# Patient Record
Sex: Female | Born: 1970 | Race: Black or African American | Hispanic: No | Marital: Single | Smoking: Never smoker
Health system: Southern US, Community
[De-identification: ages and names within clinical notes are randomized; demographics above are authoritative.]

## PROBLEM LIST (undated history)

## (undated) DIAGNOSIS — M1990 Unspecified osteoarthritis, unspecified site: Secondary | ICD-10-CM

## (undated) DIAGNOSIS — E119 Type 2 diabetes mellitus without complications: Secondary | ICD-10-CM

## (undated) DIAGNOSIS — I10 Essential (primary) hypertension: Secondary | ICD-10-CM

## (undated) DIAGNOSIS — R000 Tachycardia, unspecified: Secondary | ICD-10-CM

## (undated) DIAGNOSIS — J45909 Unspecified asthma, uncomplicated: Secondary | ICD-10-CM

## (undated) DIAGNOSIS — G5600 Carpal tunnel syndrome, unspecified upper limb: Secondary | ICD-10-CM

## (undated) DIAGNOSIS — I4510 Unspecified right bundle-branch block: Secondary | ICD-10-CM

## (undated) HISTORY — PX: TUBAL LIGATION: SHX77

## (undated) HISTORY — DX: Carpal tunnel syndrome, unspecified upper limb: G56.00

---

## 1998-02-19 ENCOUNTER — Emergency Department (HOSPITAL_COMMUNITY): Admission: EM | Admit: 1998-02-19 | Discharge: 1998-02-19 | Payer: Self-pay | Admitting: Emergency Medicine

## 1998-09-14 ENCOUNTER — Emergency Department (HOSPITAL_COMMUNITY): Admission: EM | Admit: 1998-09-14 | Discharge: 1998-09-14 | Payer: Self-pay | Admitting: Internal Medicine

## 1998-11-16 ENCOUNTER — Emergency Department (HOSPITAL_COMMUNITY): Admission: EM | Admit: 1998-11-16 | Discharge: 1998-11-16 | Payer: Self-pay | Admitting: Emergency Medicine

## 1999-03-16 ENCOUNTER — Emergency Department (HOSPITAL_COMMUNITY): Admission: EM | Admit: 1999-03-16 | Discharge: 1999-03-16 | Payer: Self-pay | Admitting: Emergency Medicine

## 1999-07-04 ENCOUNTER — Emergency Department (HOSPITAL_COMMUNITY): Admission: EM | Admit: 1999-07-04 | Discharge: 1999-07-04 | Payer: Self-pay | Admitting: Emergency Medicine

## 1999-09-03 ENCOUNTER — Inpatient Hospital Stay (HOSPITAL_COMMUNITY): Admission: EM | Admit: 1999-09-03 | Discharge: 1999-09-03 | Payer: Self-pay | Admitting: Obstetrics & Gynecology

## 1999-12-24 ENCOUNTER — Emergency Department (HOSPITAL_COMMUNITY): Admission: EM | Admit: 1999-12-24 | Discharge: 1999-12-24 | Payer: Self-pay | Admitting: Emergency Medicine

## 1999-12-24 ENCOUNTER — Encounter: Payer: Self-pay | Admitting: Emergency Medicine

## 2000-04-07 ENCOUNTER — Inpatient Hospital Stay (HOSPITAL_COMMUNITY): Admission: AD | Admit: 2000-04-07 | Discharge: 2000-04-07 | Payer: Self-pay | Admitting: Obstetrics

## 2000-04-16 ENCOUNTER — Inpatient Hospital Stay (HOSPITAL_COMMUNITY): Admission: AD | Admit: 2000-04-16 | Discharge: 2000-04-16 | Payer: Self-pay | Admitting: Obstetrics

## 2000-09-02 ENCOUNTER — Emergency Department (HOSPITAL_COMMUNITY): Admission: EM | Admit: 2000-09-02 | Discharge: 2000-09-02 | Payer: Self-pay | Admitting: Psychiatry

## 2000-10-16 ENCOUNTER — Encounter: Payer: Self-pay | Admitting: Obstetrics & Gynecology

## 2000-10-16 ENCOUNTER — Inpatient Hospital Stay (HOSPITAL_COMMUNITY): Admission: AD | Admit: 2000-10-16 | Discharge: 2000-10-16 | Payer: Self-pay | Admitting: Obstetrics & Gynecology

## 2000-11-10 ENCOUNTER — Encounter (INDEPENDENT_AMBULATORY_CARE_PROVIDER_SITE_OTHER): Payer: Self-pay

## 2000-11-10 ENCOUNTER — Encounter: Payer: Self-pay | Admitting: Obstetrics & Gynecology

## 2000-11-10 ENCOUNTER — Inpatient Hospital Stay (HOSPITAL_COMMUNITY): Admission: AD | Admit: 2000-11-10 | Discharge: 2000-11-12 | Payer: Self-pay | Admitting: *Deleted

## 2000-11-27 ENCOUNTER — Encounter: Admission: RE | Admit: 2000-11-27 | Discharge: 2000-11-27 | Payer: Self-pay | Admitting: Obstetrics

## 2000-12-03 ENCOUNTER — Ambulatory Visit (HOSPITAL_COMMUNITY): Admission: AD | Admit: 2000-12-03 | Discharge: 2000-12-03 | Payer: Self-pay | Admitting: Obstetrics

## 2000-12-03 ENCOUNTER — Encounter: Payer: Self-pay | Admitting: Obstetrics

## 2000-12-03 ENCOUNTER — Encounter (INDEPENDENT_AMBULATORY_CARE_PROVIDER_SITE_OTHER): Payer: Self-pay

## 2001-01-06 ENCOUNTER — Encounter: Admission: RE | Admit: 2001-01-06 | Discharge: 2001-01-06 | Payer: Self-pay | Admitting: Obstetrics & Gynecology

## 2001-01-20 ENCOUNTER — Encounter: Admission: RE | Admit: 2001-01-20 | Discharge: 2001-01-20 | Payer: Self-pay | Admitting: Obstetrics & Gynecology

## 2001-01-21 ENCOUNTER — Encounter: Payer: Self-pay | Admitting: Emergency Medicine

## 2001-01-21 ENCOUNTER — Emergency Department (HOSPITAL_COMMUNITY): Admission: EM | Admit: 2001-01-21 | Discharge: 2001-01-21 | Payer: Self-pay | Admitting: Emergency Medicine

## 2001-05-26 ENCOUNTER — Inpatient Hospital Stay (HOSPITAL_COMMUNITY): Admission: AD | Admit: 2001-05-26 | Discharge: 2001-05-26 | Payer: Self-pay | Admitting: *Deleted

## 2002-02-11 ENCOUNTER — Emergency Department (HOSPITAL_COMMUNITY): Admission: EM | Admit: 2002-02-11 | Discharge: 2002-02-11 | Payer: Self-pay | Admitting: Emergency Medicine

## 2002-05-03 ENCOUNTER — Emergency Department (HOSPITAL_COMMUNITY): Admission: EM | Admit: 2002-05-03 | Discharge: 2002-05-03 | Payer: Self-pay | Admitting: *Deleted

## 2002-06-09 ENCOUNTER — Emergency Department (HOSPITAL_COMMUNITY): Admission: EM | Admit: 2002-06-09 | Discharge: 2002-06-09 | Payer: Self-pay | Admitting: Emergency Medicine

## 2003-03-26 ENCOUNTER — Inpatient Hospital Stay (HOSPITAL_COMMUNITY): Admission: AD | Admit: 2003-03-26 | Discharge: 2003-03-26 | Payer: Self-pay | Admitting: Obstetrics & Gynecology

## 2003-04-15 ENCOUNTER — Encounter: Payer: Self-pay | Admitting: Obstetrics

## 2003-04-15 ENCOUNTER — Ambulatory Visit (HOSPITAL_COMMUNITY): Admission: RE | Admit: 2003-04-15 | Discharge: 2003-04-15 | Payer: Self-pay | Admitting: Obstetrics

## 2003-04-25 ENCOUNTER — Inpatient Hospital Stay (HOSPITAL_COMMUNITY): Admission: AD | Admit: 2003-04-25 | Discharge: 2003-04-25 | Payer: Self-pay | Admitting: *Deleted

## 2003-04-25 ENCOUNTER — Encounter (INDEPENDENT_AMBULATORY_CARE_PROVIDER_SITE_OTHER): Payer: Self-pay | Admitting: Specialist

## 2003-09-06 ENCOUNTER — Emergency Department (HOSPITAL_COMMUNITY): Admission: EM | Admit: 2003-09-06 | Discharge: 2003-09-06 | Payer: Self-pay | Admitting: Family Medicine

## 2003-12-22 ENCOUNTER — Emergency Department (HOSPITAL_COMMUNITY): Admission: EM | Admit: 2003-12-22 | Discharge: 2003-12-23 | Payer: Self-pay | Admitting: Emergency Medicine

## 2004-05-17 ENCOUNTER — Emergency Department (HOSPITAL_COMMUNITY): Admission: EM | Admit: 2004-05-17 | Discharge: 2004-05-18 | Payer: Self-pay | Admitting: Emergency Medicine

## 2004-06-29 ENCOUNTER — Emergency Department (HOSPITAL_COMMUNITY): Admission: EM | Admit: 2004-06-29 | Discharge: 2004-06-30 | Payer: Self-pay | Admitting: Emergency Medicine

## 2004-07-01 HISTORY — PX: TUBAL LIGATION: SHX77

## 2004-08-30 ENCOUNTER — Inpatient Hospital Stay (HOSPITAL_COMMUNITY): Admission: AD | Admit: 2004-08-30 | Discharge: 2004-08-30 | Payer: Self-pay | Admitting: Obstetrics

## 2004-09-08 ENCOUNTER — Inpatient Hospital Stay (HOSPITAL_COMMUNITY): Admission: AD | Admit: 2004-09-08 | Discharge: 2004-09-08 | Payer: Self-pay | Admitting: Obstetrics & Gynecology

## 2004-10-03 ENCOUNTER — Emergency Department (HOSPITAL_COMMUNITY): Admission: EM | Admit: 2004-10-03 | Discharge: 2004-10-03 | Payer: Self-pay | Admitting: Emergency Medicine

## 2004-11-06 ENCOUNTER — Ambulatory Visit (HOSPITAL_COMMUNITY): Admission: RE | Admit: 2004-11-06 | Discharge: 2004-11-06 | Payer: Self-pay | Admitting: Obstetrics & Gynecology

## 2005-01-14 ENCOUNTER — Inpatient Hospital Stay (HOSPITAL_COMMUNITY): Admission: AD | Admit: 2005-01-14 | Discharge: 2005-01-14 | Payer: Self-pay | Admitting: Obstetrics & Gynecology

## 2005-01-30 ENCOUNTER — Inpatient Hospital Stay (HOSPITAL_COMMUNITY): Admission: AD | Admit: 2005-01-30 | Discharge: 2005-01-30 | Payer: Self-pay | Admitting: Obstetrics

## 2005-02-08 ENCOUNTER — Ambulatory Visit (HOSPITAL_COMMUNITY): Admission: RE | Admit: 2005-02-08 | Discharge: 2005-02-08 | Payer: Self-pay | Admitting: Obstetrics & Gynecology

## 2005-03-06 ENCOUNTER — Inpatient Hospital Stay (HOSPITAL_COMMUNITY): Admission: AD | Admit: 2005-03-06 | Discharge: 2005-03-06 | Payer: Self-pay | Admitting: Obstetrics & Gynecology

## 2005-03-10 ENCOUNTER — Inpatient Hospital Stay (HOSPITAL_COMMUNITY): Admission: AD | Admit: 2005-03-10 | Discharge: 2005-03-10 | Payer: Self-pay | Admitting: Obstetrics & Gynecology

## 2005-03-27 ENCOUNTER — Inpatient Hospital Stay (HOSPITAL_COMMUNITY): Admission: AD | Admit: 2005-03-27 | Discharge: 2005-03-27 | Payer: Self-pay | Admitting: Obstetrics & Gynecology

## 2005-04-01 ENCOUNTER — Inpatient Hospital Stay (HOSPITAL_COMMUNITY): Admission: AD | Admit: 2005-04-01 | Discharge: 2005-04-02 | Payer: Self-pay | Admitting: Obstetrics & Gynecology

## 2005-04-04 ENCOUNTER — Inpatient Hospital Stay (HOSPITAL_COMMUNITY): Admission: AD | Admit: 2005-04-04 | Discharge: 2005-04-04 | Payer: Self-pay | Admitting: Obstetrics & Gynecology

## 2005-04-10 ENCOUNTER — Inpatient Hospital Stay (HOSPITAL_COMMUNITY): Admission: AD | Admit: 2005-04-10 | Discharge: 2005-04-10 | Payer: Self-pay | Admitting: Obstetrics

## 2005-04-14 ENCOUNTER — Inpatient Hospital Stay (HOSPITAL_COMMUNITY): Admission: AD | Admit: 2005-04-14 | Discharge: 2005-04-17 | Payer: Self-pay | Admitting: Obstetrics

## 2005-04-15 ENCOUNTER — Encounter (INDEPENDENT_AMBULATORY_CARE_PROVIDER_SITE_OTHER): Payer: Self-pay | Admitting: Specialist

## 2006-11-25 ENCOUNTER — Emergency Department (HOSPITAL_COMMUNITY): Admission: EM | Admit: 2006-11-25 | Discharge: 2006-11-25 | Payer: Self-pay | Admitting: Family Medicine

## 2006-12-22 ENCOUNTER — Ambulatory Visit (HOSPITAL_COMMUNITY): Admission: RE | Admit: 2006-12-22 | Discharge: 2006-12-22 | Payer: Self-pay | Admitting: Obstetrics and Gynecology

## 2007-03-26 ENCOUNTER — Emergency Department (HOSPITAL_COMMUNITY): Admission: EM | Admit: 2007-03-26 | Discharge: 2007-03-26 | Payer: Self-pay | Admitting: Family Medicine

## 2007-06-29 ENCOUNTER — Emergency Department (HOSPITAL_COMMUNITY): Admission: EM | Admit: 2007-06-29 | Discharge: 2007-06-29 | Payer: Self-pay | Admitting: Emergency Medicine

## 2008-04-11 ENCOUNTER — Encounter: Admission: RE | Admit: 2008-04-11 | Discharge: 2008-04-11 | Payer: Self-pay | Admitting: Internal Medicine

## 2008-08-25 ENCOUNTER — Emergency Department (HOSPITAL_COMMUNITY): Admission: EM | Admit: 2008-08-25 | Discharge: 2008-08-25 | Payer: Self-pay | Admitting: Emergency Medicine

## 2008-09-26 ENCOUNTER — Emergency Department (HOSPITAL_COMMUNITY): Admission: EM | Admit: 2008-09-26 | Discharge: 2008-09-27 | Payer: Self-pay | Admitting: Emergency Medicine

## 2009-07-24 ENCOUNTER — Encounter: Admission: RE | Admit: 2009-07-24 | Discharge: 2009-07-24 | Payer: Self-pay | Admitting: Internal Medicine

## 2010-07-22 ENCOUNTER — Encounter: Payer: Self-pay | Admitting: Internal Medicine

## 2010-10-11 LAB — CBC
Hemoglobin: 12.4 g/dL (ref 12.0–15.0)
MCHC: 33.9 g/dL (ref 30.0–36.0)
MCV: 87.9 fL (ref 78.0–100.0)
RBC: 4.16 MIL/uL (ref 3.87–5.11)
WBC: 6.6 10*3/uL (ref 4.0–10.5)

## 2010-10-11 LAB — URINALYSIS, ROUTINE W REFLEX MICROSCOPIC
Bilirubin Urine: NEGATIVE
Glucose, UA: NEGATIVE mg/dL
Ketones, ur: NEGATIVE mg/dL
pH: 6 (ref 5.0–8.0)

## 2010-10-11 LAB — COMPREHENSIVE METABOLIC PANEL
ALT: 16 U/L (ref 0–35)
Alkaline Phosphatase: 72 U/L (ref 39–117)
CO2: 27 mEq/L (ref 19–32)
GFR calc Af Amer: >60 mL/min (ref >60)
GFR calc non Af Amer: >60 mL/min (ref >60)
Glucose, Bld: 99 mg/dL (ref 70–99)
Potassium: 3.8 mEq/L (ref 3.5–5.1)
Sodium: 140 mEq/L (ref 135–145)

## 2010-10-11 LAB — DIFFERENTIAL
Basophils Relative: 1 % (ref 0–1)
Eosinophils Relative: 7 % — ABNORMAL HIGH (ref 0–5)
Lymphs Abs: 2.2 10*3/uL (ref 0.7–4.0)
Monocytes Absolute: 0.5 10*3/uL (ref 0.1–1.0)

## 2010-11-13 NOTE — Op Note (Signed)
Yesenia Simmons, Yesenia Simmons             ACCOUNT NO.:  [id number]   MEDICAL RECORD NO.:  [id number]          PATIENT TYPE:  AMB   LOCATION:  SDC                           FACILITY:  [facility]   PHYSICIAN:  Dois Davenport A. Rivard, M.D. DATE OF BIRTH:  [birth year]   DATE OF PROCEDURE:  12/22/2006  DATE OF DISCHARGE:                               OPERATIVE REPORT   PREOPERATIVE DIAGNOSIS:  Desire for sterilization.   POSTOPERATIVE DIAGNOSIS:  Desire for sterilization.   ANESTHESIA:  General.   PROCEDURE:  Bilateral tubal ligation via laparoscopy.   SURGEON:  Crist Fat. Rivard, M.D.   ESTIMATED BLOOD LOSS:  Minimal.   PROCEDURE:  After being informed of the planned procedure with possible  complications including bleeding, infection, injury to bowel, bladder or  ureters, need for laparotomy, irreversibility of the procedure as well  as failure rate of 1 in 500, informed consent is obtained.  The patient  is taken to OR #8 given general anesthesia with endotracheal intubation  without any complication.  She is placed in lithotomy position, prepped  and draped in a sterile fashion.  Her bladder is emptied with an in-and-  out Foley catheter.  Pelvic exam reveals an anteverted uterus, two  normal adnexa.  A speculum is inserted, anterior lip of the cervix was  grasped with a tenaculum, forceps and an acorn manipulator is inserted  easily, speculum is removed.   We infiltrate the umbilical area with 5 mL of Marcaine 0.25 and perform  a semi elliptical incision which was brought down sharply and bluntly to  the fascia.  The fascia is identified, grasped with Kocher forceps,  incised with Mayo scissors and peritoneum is entered bluntly.  A  pursestring suture of 0 Vicryl was placed on the fascia and a 10 mm  Hassan trocar is inserted easily held in place with pursestring suture.  This allows [place] to insufflate a pneumoperitoneum with CO2 at a maximum  pressure of 15 mmHg.   Camera is inserted.   Observation:  Anterior and posterior cul-de-sac are  normal, uterus is normal.  Both tubes are normal.  Both ovaries are  normal. Bowels appeared normal. Appendix is not visualized.  Liver is  normal, gallbladder is normal.   We then proceed with cauterization using Kleppinger forceps of both  tubes and the isthmic ampullary area on a distance of 1.5 cm including  mesosalpinx until complete blanching.  Instruments are then removed  after evacuating the pneumoperitoneum and the previously placed  pursestring suture is closed after ensuring no bowel loops was  protruding.  Skin is then closed with a subcuticular suture of 3-0  Monocryl and Steri-Strips.   Instrument and sponge count is complete x2.  Estimated blood loss is  minimal. Procedure is very well tolerated by the patient who is taken to  recovery room in a well and stable condition.      Crist Fat Rivard, M.D.  Electronically Signed     SAR/MEDQ  D:  12/22/2006  T:  12/22/2006  Job:  [id number]

## 2010-11-16 NOTE — Discharge Summary (Signed)
[facility]  Patient:    Yesenia Simmons, Yesenia Simmons                      MRN: [id number] Adm. Date:  [id number] Disc. Date: [id number] Attending:  Tammi Sou Dictator:   Jamey Reas, M.D.                           Discharge Summary  DATE OF BIRTH:                07/20/1970  ADMISSION DIAGNOSES:          Intrauterine fetal demise at approximately 14 weeks.  DISCHARGE DIAGNOSES:          Intrauterine fetal demise at approximately 14 weeks, induced abortion of intrauterine fetal demise without complication.  HOSPITAL COURSE:              A 40 year old G3, P1-1-0-2 who had a history of falling down a hill two days prior to admission and presents to triage complaining of low back cramping and also right knee abrasion.  Dopplers could not be identified on patient.  She had an ultrasound which showed an intrauterine fetal demise at approximately 14 weeks 5 days.  Patient was admitted for induction of labor.  On the day of admission Dr. Coral Ceo placed ______ x 4 in her cervix.  This was usual mechanical dilation approximately 12 hours.  Cytotec 600 mcg was then placed in her vagina by Marvis Moeller at midnight on Nov 11, 2000.  I was called to her room.  She had delivered her fetus.  At that time placed Cytotec 200 mcg per vagina to help continue the induction of the products of conception.  At 1445 was called by nursing to assess as patient had passed an intact placenta at 1515.  No further vaginal bleeding at that time.  Patient was observed over that night and discharged on the morning of Nov 12, 2000 without complications.  Patient was discharged on Motrin for cramping.  She was discharged to home in good condition.  Patient was to follow-up in the GYN clinic in two weeks for further evaluation.  She received Depo 150 mg IM prior to discharge.  She also received doxycycline 100 mg b.i.d. x 7 days and methergine 0.2 mg q.6h. x 2 days.   Patient will follow-up at the GYN clinic in two weeks for further evaluation. DD:  12/08/00 TD:  12/08/00 Job: [id number]

## 2011-04-05 LAB — CBC
HCT: 36.9
MCV: 85.1
Platelets: 208
RDW: 13.6

## 2011-04-05 LAB — URINALYSIS, ROUTINE W REFLEX MICROSCOPIC
Bilirubin Urine: NEGATIVE
Glucose, UA: NEGATIVE
Hgb urine dipstick: NEGATIVE
Ketones, ur: NEGATIVE
Protein, ur: NEGATIVE
Specific Gravity, Urine: 1.036 — ABNORMAL HIGH

## 2011-04-05 LAB — BASIC METABOLIC PANEL
BUN: 13
CO2: 24
Chloride: 109
Creatinine, Ser: 0.49

## 2011-04-05 LAB — DIFFERENTIAL
Basophils Absolute: 0
Basophils Relative: 0
Eosinophils Relative: 7 — ABNORMAL HIGH
Monocytes Absolute: 0.4
Monocytes Relative: 7

## 2011-04-05 LAB — POCT PREGNANCY, URINE: Preg Test, Ur: NEGATIVE

## 2011-04-11 LAB — POCT URINALYSIS DIP (DEVICE)
Bilirubin Urine: NEGATIVE
Hgb urine dipstick: NEGATIVE
Ketones, ur: NEGATIVE
Specific Gravity, Urine: 1.02
pH: 6

## 2011-04-11 LAB — POCT PREGNANCY, URINE
Operator id: 247071
Preg Test, Ur: NEGATIVE

## 2011-04-11 LAB — WET PREP, GENITAL
Trich, Wet Prep: NONE SEEN
WBC, Wet Prep HPF POC: NONE SEEN

## 2011-04-11 LAB — GC/CHLAMYDIA PROBE AMP, GENITAL: GC Probe Amp, Genital: NEGATIVE

## 2011-04-17 LAB — CBC
MCHC: 33.8
RDW: 13.1

## 2011-04-17 LAB — PREGNANCY, URINE: Preg Test, Ur: NEGATIVE

## 2014-05-24 ENCOUNTER — Emergency Department (HOSPITAL_COMMUNITY)
Admission: EM | Admit: 2014-05-24 | Discharge: 2014-05-24 | Disposition: A | Discharge status: Home / Self Care | Payer: Medicaid Other | Attending: Emergency Medicine | Admitting: Emergency Medicine

## 2014-05-24 ENCOUNTER — Encounter (HOSPITAL_COMMUNITY): Payer: Self-pay | Admitting: Emergency Medicine

## 2014-05-24 DIAGNOSIS — Y998 Other external cause status: Secondary | ICD-10-CM | POA: Insufficient documentation

## 2014-05-24 DIAGNOSIS — X58XXXA Exposure to other specified factors, initial encounter: Secondary | ICD-10-CM | POA: Insufficient documentation

## 2014-05-24 DIAGNOSIS — Y9289 Other specified places as the place of occurrence of the external cause: Secondary | ICD-10-CM | POA: Diagnosis not present

## 2014-05-24 DIAGNOSIS — Y9389 Activity, other specified: Secondary | ICD-10-CM | POA: Insufficient documentation

## 2014-05-24 DIAGNOSIS — I10 Essential (primary) hypertension: Secondary | ICD-10-CM | POA: Diagnosis not present

## 2014-05-24 DIAGNOSIS — S3991XA Unspecified injury of abdomen, initial encounter: Secondary | ICD-10-CM | POA: Diagnosis present

## 2014-05-24 DIAGNOSIS — Z3202 Encounter for pregnancy test, result negative: Secondary | ICD-10-CM | POA: Insufficient documentation

## 2014-05-24 DIAGNOSIS — J45909 Unspecified asthma, uncomplicated: Secondary | ICD-10-CM | POA: Diagnosis not present

## 2014-05-24 DIAGNOSIS — S39012A Strain of muscle, fascia and tendon of lower back, initial encounter: Secondary | ICD-10-CM | POA: Diagnosis not present

## 2014-05-24 HISTORY — DX: Essential (primary) hypertension: I10

## 2014-05-24 HISTORY — DX: Unspecified asthma, uncomplicated: J45.909

## 2014-05-24 LAB — URINALYSIS, ROUTINE W REFLEX MICROSCOPIC
BILIRUBIN URINE: NEGATIVE
GLUCOSE, UA: NEGATIVE mg/dL
HGB URINE DIPSTICK: NEGATIVE
Ketones, ur: NEGATIVE mg/dL
Leukocytes, UA: NEGATIVE
Nitrite: NEGATIVE
PH: 7.5 (ref 5.0–8.0)
Protein, ur: NEGATIVE mg/dL
SPECIFIC GRAVITY, URINE: 1.026 (ref 1.005–1.030)
Urobilinogen, UA: 1 mg/dL (ref 0.0–1.0)

## 2014-05-24 LAB — WET PREP, GENITAL
TRICH WET PREP: NONE SEEN
Yeast Wet Prep HPF POC: NONE SEEN

## 2014-05-24 LAB — PREGNANCY, URINE: PREG TEST UR: NEGATIVE

## 2014-05-24 MED ORDER — NAPROXEN 500 MG PO TABS
500.0000 mg | ORAL_TABLET | Freq: Once | ORAL | Status: AC
  Administered 2014-05-24: 500 mg via ORAL
  Filled 2014-05-24: qty 1

## 2014-05-24 MED ORDER — METHOCARBAMOL 500 MG PO TABS: 500.0000 mg | ORAL_TABLET | Freq: Two times a day (BID) | ORAL | Status: DC | PRN

## 2014-05-24 MED ORDER — NAPROXEN 500 MG PO TABS: 500.0000 mg | ORAL_TABLET | Freq: Two times a day (BID) | ORAL | Status: DC

## 2014-05-24 NOTE — ED Provider Notes (Addendum)
CSN: [id number]     Arrival date & time 05/24/14  1524 History   First MD Initiated Contact with Patient 05/24/14 1530     Chief Complaint  Patient presents with  . Flank Pain    right     (Consider location/radiation/quality/duration/timing/severity/associated sxs/prior Treatment) HPI Comments: The patient is a 43 year old female who presents to the hospital with a complaint of right lower back pain which occurred starting yesterday when she went to pick up something heavy. She bent over and felt acute onset of pain in the right flank, this has been mild to moderate, persistent, relieved with rest, worsened with movement and position or palpation. She has no associated fevers chills nausea or vomiting but does have some new dysuria as well as vaginal itching. She thinks that this started after she used a scented maxi pad, she denies any other risk factors for sexually transmitted diseases including no new sexual partners, no other discharge. She does report that her urine is malodorous and cloudy.  Patient is a 43 y.o. female presenting with flank pain. The history is provided by the patient.  Flank Pain    Past Medical History  Diagnosis Date  . Asthma   . Hypertension    History reviewed. No pertinent past surgical history. No family history on file. History  Substance Use Topics  . Smoking status: Not on file  . Smokeless tobacco: Not on file  . Alcohol Use: Not on file   OB History    No data available     Review of Systems  Genitourinary: Positive for flank pain.  All other systems reviewed and are negative.     Allergies  Review of patient's allergies indicates not on file.  Home Medications   Prior to Admission medications   Medication Sig Start Date End Date Taking? Authorizing Provider  methocarbamol (ROBAXIN) 500 MG tablet Take 1 tablet (500 mg total) by mouth 2 (two) times daily as needed for muscle spasms. 05/24/14   Johnna Acosta, MD  naproxen  (NAPROSYN) 500 MG tablet Take 1 tablet (500 mg total) by mouth 2 (two) times daily with a meal. 05/24/14   Johnna Acosta, MD   BP 116/65 mmHg  Pulse 64  Temp(Src) 97.8 F (36.6 C) (Oral)  Resp 16  SpO2 100%  LMP 05/16/2014 Physical Exam  Constitutional: She appears well-developed and well-nourished. No distress.  HENT:  Head: Normocephalic and atraumatic.  Mouth/Throat: Oropharynx is clear and moist. No oropharyngeal exudate.  Eyes: Conjunctivae and EOM are normal. Pupils are equal, round, and reactive to light. Right eye exhibits no discharge. Left eye exhibits no discharge. No scleral icterus.  Neck: Normal range of motion. Neck supple. No JVD present. No thyromegaly present.  Cardiovascular: Normal rate, regular rhythm, normal heart sounds and intact distal pulses.  Exam reveals no gallop and no friction rub.   No murmur heard. Pulmonary/Chest: Effort normal and breath sounds normal. No respiratory distress. She has no wheezes. She has no rales.  Abdominal: Soft. Bowel sounds are normal. She exhibits no distension and no mass. There is tenderness (. Located in the mid abdomen, no pain in McBurney's point).  Genitourinary:  Moderate amount of white discharge, there is no foul odor, no bleeding, no foreign body. Chaperone present for exam.  Musculoskeletal: Normal range of motion. She exhibits tenderness ( Tenderness over the right lower back and flank, no signs of rashes swelling or bruising). She exhibits no edema.  Lymphadenopathy:    She has  no cervical adenopathy.  Neurological: She is alert. Coordination normal.  Skin: Skin is warm and dry. No rash noted. No erythema.  Psychiatric: She has a normal mood and affect. Her behavior is normal.  Nursing note and vitals reviewed.   ED Course  Procedures (including critical care time) Labs Review Labs Reviewed  URINALYSIS, ROUTINE W REFLEX MICROSCOPIC - Abnormal; Notable for the following:    APPearance CLOUDY (*)    All other  components within normal limits  GC/CHLAMYDIA PROBE AMP  WET PREP, GENITAL  PREGNANCY, URINE    Imaging Review No results found.    MDM   Final diagnoses:  Lumbar strain, initial encounter    Bedside ultrasound reveals no signs of hydronephrosis, there is minimal urine in the bladder, vital signs are totally normal, pain in the flank likely musculoskeletal, possibly coexisting urinary infection, patient request pelvic exam given itching worried for infection. Otherwise patient appears stable, Naprosyn for pain, urine pending.  Emergency Focused Ultrasound Exam Limited retroperitoneal ultrasound of kidneys  Performed and interpreted by Dr. Sabra Heck Indication: flank pain Focused abdominal ultrasound with both kidneys imaged in transverse and longitudinal planes in real-time. Interpretation: No  hydronephrosis visualized.   Images archived electronically   Meds given in ED:  Medications  naproxen (NAPROSYN) tablet 500 mg (500 mg Oral Given 05/24/14 1600)    New Prescriptions   METHOCARBAMOL (ROBAXIN) 500 MG TABLET    Take 1 tablet (500 mg total) by mouth 2 (two) times daily as needed for muscle spasms.   NAPROXEN (NAPROSYN) 500 MG TABLET    Take 1 tablet (500 mg total) by mouth 2 (two) times daily with a meal.        Johnna Acosta, MD 05/24/14 1620  Johnna Acosta, MD 05/24/14 [phone]

## 2014-05-24 NOTE — Discharge Instructions (Signed)

## 2014-05-24 NOTE — ED Notes (Signed)
Pt reports urinary frequency and urgency, itching and right flank pain radiating to back. Also reports odorous urine. Pt denies N/V.

## 2014-05-25 LAB — GC/CHLAMYDIA PROBE AMP
CT PROBE, AMP APTIMA: NEGATIVE
GC PROBE AMP APTIMA: NEGATIVE

## 2016-02-22 ENCOUNTER — Ambulatory Visit (HOSPITAL_COMMUNITY)
Admission: EM | Admit: 2016-02-22 | Discharge: 2016-02-22 | Disposition: A | Discharge status: Home / Self Care | Payer: BLUE CROSS/BLUE SHIELD | Attending: Emergency Medicine | Admitting: Emergency Medicine

## 2016-02-22 ENCOUNTER — Encounter (HOSPITAL_COMMUNITY): Payer: Self-pay | Admitting: Emergency Medicine

## 2016-02-22 DIAGNOSIS — N760 Acute vaginitis: Secondary | ICD-10-CM | POA: Insufficient documentation

## 2016-02-22 DIAGNOSIS — N898 Other specified noninflammatory disorders of vagina: Secondary | ICD-10-CM

## 2016-02-22 DIAGNOSIS — J029 Acute pharyngitis, unspecified: Secondary | ICD-10-CM | POA: Diagnosis not present

## 2016-02-22 LAB — POCT URINALYSIS DIP (DEVICE)
BILIRUBIN URINE: NEGATIVE
Glucose, UA: NEGATIVE mg/dL
KETONES UR: NEGATIVE mg/dL
NITRITE: NEGATIVE
PH: 6 (ref 5.0–8.0)
Protein, ur: NEGATIVE mg/dL
Specific Gravity, Urine: 1.025 (ref 1.005–1.030)
Urobilinogen, UA: 0.2 mg/dL (ref 0.0–1.0)

## 2016-02-22 LAB — POCT PREGNANCY, URINE: PREG TEST UR: NEGATIVE

## 2016-02-22 LAB — POCT RAPID STREP A: STREPTOCOCCUS, GROUP A SCREEN (DIRECT): NEGATIVE

## 2016-02-22 MED ORDER — FLUCONAZOLE 150 MG PO TABS: 150.0000 mg | ORAL_TABLET | Freq: Every day | ORAL | 0 refills | Status: DC

## 2016-02-22 MED ORDER — METRONIDAZOLE 500 MG PO TABS: 500.0000 mg | ORAL_TABLET | Freq: Two times a day (BID) | ORAL | 0 refills | Status: DC

## 2016-02-22 NOTE — ED Provider Notes (Signed)
[place]    CSN: [id number] Arrival date & time: 02/22/16  [id number]  First Provider Contact:  First MD Initiated Contact with Patient 02/22/16 1952        History   Chief Complaint Chief Complaint  Patient presents with  . Vaginal Discharge    HPI Yesenia Simmons is a 45 y.o. female.   HPI  Past Medical History:  Diagnosis Date  . Asthma   . Hypertension     There are no active problems to display for this patient.   History reviewed. No pertinent surgical history.  OB History    No data available       Home Medications    Prior to Admission medications   Medication Sig Start Date End Date Taking? Authorizing Provider  methocarbamol (ROBAXIN) 500 MG tablet Take 1 tablet (500 mg total) by mouth 2 (two) times daily as needed for muscle spasms. 05/24/14   Noemi Chapel, MD  naproxen (NAPROSYN) 500 MG tablet Take 1 tablet (500 mg total) by mouth 2 (two) times daily with a meal. 05/24/14   Noemi Chapel, MD    Family History No family history on file.  Social History Social History  Substance Use Topics  . Smoking status: Never Smoker  . Smokeless tobacco: Never Used  . Alcohol use No     Allergies   Review of patient's allergies indicates no known allergies.   Review of Systems Review of Systems  Constitutional: Negative.   HENT: Positive for sore throat. Negative for congestion, drooling, ear discharge, ear pain, facial swelling, postnasal drip, trouble swallowing and voice change.   Eyes: Negative.   Respiratory: Negative.   Cardiovascular: Negative.   Gastrointestinal: Negative.   Genitourinary: Positive for menstrual problem (started menstrual cycle a few days ago and had some spotting and then recently stopped. ) and vaginal discharge (white). Negative for flank pain, pelvic pain, urgency, vaginal bleeding and vaginal pain.       External genitalia itching.   Musculoskeletal: Negative.   Skin: Negative.   Psychiatric/Behavioral:  Negative.      Physical Exam Triage Vital Signs ED Triage Vitals  Enc Vitals Group     BP 02/22/16 1844 134/76     Pulse Rate 02/22/16 1844 87     Resp 02/22/16 1844 12     Temp 02/22/16 1844 97.9 F (36.6 C)     Temp Source 02/22/16 1844 Oral     SpO2 02/22/16 1844 100 %     Weight --      Height --      Head Circumference --      Peak Flow --      Pain Score 02/22/16 1907 6     Pain Loc --      Pain Edu? --      Excl. in [place]? --    No data found.   Updated Vital Signs BP 134/76 (BP Location: Right Arm)   Pulse 87   Temp 97.9 F (36.6 C) (Oral)   Resp 12   LMP 02/19/2016   SpO2 100%   Visual Acuity Right Eye Distance:   Left Eye Distance:   Bilateral Distance:    Right Eye Near:   Left Eye Near:    Bilateral Near:     Physical Exam  Constitutional: She is oriented to person, place, and time. No distress.  HENT:  Head: Normocephalic and atraumatic.  Mouth/Throat: Oropharynx is clear and moist. No oropharyngeal exudate.  Eyes: EOM  are normal. Pupils are equal, round, and reactive to light.  Neck: Normal range of motion.  Cardiovascular: Normal rate.   Pulmonary/Chest: Effort normal and breath sounds normal. No respiratory distress. She has no wheezes.  Abdominal: Soft. Bowel sounds are normal. She exhibits no distension. There is no tenderness.  Genitourinary: Vaginal discharge (pelvic exam.  white vaginal discharge.  no external lesions. ) found.  Musculoskeletal: Normal range of motion.  Lymphadenopathy:    She has cervical adenopathy.  Neurological: She is alert and oriented to person, place, and time.  Skin: Skin is warm and dry.  Psychiatric: She has a normal mood and affect. Her behavior is normal.     UC Treatments / Results  Labs (all labs ordered are listed, but only abnormal results are displayed) Labs Reviewed  POCT URINALYSIS DIP (DEVICE) - Abnormal; Notable for the following:       Result Value   Hgb urine dipstick TRACE (*)     Leukocytes, UA SMALL (*)    All other components within normal limits  HIV ANTIBODY (ROUTINE TESTING)  RPR  CERVICOVAGINAL ANCILLARY ONLY    EKG  EKG Interpretation None       Radiology No results found.  Procedures Procedures (including critical care time)  Medications Ordered in UC Medications - No data to display   Initial Impression / Assessment and Plan / UC Course  I have reviewed the triage vital signs and the nursing notes.  Pertinent labs & imaging results that were available during my care of the patient were reviewed by me and considered in my medical decision making (see chart for details).  Clinical Course      Final Clinical Impressions(s) / UC Diagnoses   Final diagnoses:  Vaginal discharge  Sore throat  Vaginitis    New Prescriptions Discharge Medication List as of 02/22/2016  9:12 PM    START taking these medications   Details  fluconazole (DIFLUCAN) 150 MG tablet Take 1 tablet (150 mg total) by mouth daily., Starting Thu 02/22/2016, Print    metroNIDAZOLE (FLAGYL) 500 MG tablet Take 1 tablet (500 mg total) by mouth 2 (two) times daily., Starting Thu 02/22/2016, Print      advised patient that for now we will treat for BV and yeast infection until we the results of her labs.  If symptoms worsen she will return back to the urgent care.  Advised patient that she needs to get established with an ob/gyn in the area since she recently moved to [place].  Voices understanding.  For sore throat can use tylenol and ibuprofen for pain.  Told patient this is possibly a viral pharyngitis.     Lanae Crumbly, PA-C 02/22/16 2202

## 2016-02-22 NOTE — ED Triage Notes (Signed)
Pt c/o white vag d/c onset x3 days   Also reports trace of blood in her urine. This was told when she doing a urine drug screen yest  A&O x4... NAD

## 2016-02-23 LAB — RPR: RPR: NONREACTIVE

## 2016-02-23 LAB — CERVICOVAGINAL ANCILLARY ONLY
Chlamydia: NEGATIVE
NEISSERIA GONORRHEA: NEGATIVE
Wet Prep (BD Affirm): POSITIVE — AB

## 2016-02-23 LAB — HIV ANTIBODY (ROUTINE TESTING W REFLEX): HIV SCREEN 4TH GENERATION: NONREACTIVE

## 2016-02-25 LAB — CULTURE, GROUP A STREP (THRC)

## 2016-02-26 ENCOUNTER — Telehealth (HOSPITAL_COMMUNITY): Payer: Self-pay | Admitting: Emergency Medicine

## 2016-02-26 NOTE — Telephone Encounter (Signed)
-----   Message from Sherlene Shams, MD sent at 02/24/2016  6:29 PM EDT ----- Please let patient know that tests for gardnerella (bacterial vaginosis) and for trichomonas were positive.   Rx metronidazole was given at the [facility] visit 02/22/16.  Recheck for further evaluation if symptoms persist.  LM

## 2016-02-26 NOTE — Telephone Encounter (Signed)
Called [phone] Need to see how pt is doing and to give lab results from recent visit on 8/24 Also let pt know labs can be obtained from [place]

## 2016-04-01 ENCOUNTER — Ambulatory Visit (INDEPENDENT_AMBULATORY_CARE_PROVIDER_SITE_OTHER): Payer: Medicaid Other

## 2016-04-01 ENCOUNTER — Encounter (HOSPITAL_COMMUNITY): Payer: Self-pay | Admitting: Emergency Medicine

## 2016-04-01 ENCOUNTER — Ambulatory Visit (HOSPITAL_COMMUNITY)
Admission: EM | Admit: 2016-04-01 | Discharge: 2016-04-01 | Disposition: A | Discharge status: Home / Self Care | Payer: Medicaid Other | Attending: Internal Medicine | Admitting: Internal Medicine

## 2016-04-01 DIAGNOSIS — M545 Low back pain, unspecified: Secondary | ICD-10-CM

## 2016-04-01 DIAGNOSIS — R109 Unspecified abdominal pain: Secondary | ICD-10-CM | POA: Diagnosis present

## 2016-04-01 DIAGNOSIS — R350 Frequency of micturition: Secondary | ICD-10-CM | POA: Insufficient documentation

## 2016-04-01 LAB — POCT URINALYSIS DIP (DEVICE)
Bilirubin Urine: NEGATIVE
Glucose, UA: NEGATIVE mg/dL
Hgb urine dipstick: NEGATIVE
KETONES UR: NEGATIVE mg/dL
Leukocytes, UA: NEGATIVE
Nitrite: NEGATIVE
PH: 7 (ref 5.0–8.0)
PROTEIN: 30 mg/dL — AB
Specific Gravity, Urine: 1.02 (ref 1.005–1.030)
Urobilinogen, UA: 1 mg/dL (ref 0.0–1.0)

## 2016-04-01 LAB — GLUCOSE, CAPILLARY: Glucose-Capillary: 88 mg/dL (ref 65–99)

## 2016-04-01 LAB — POCT PREGNANCY, URINE: PREG TEST UR: NEGATIVE

## 2016-04-01 MED ORDER — CEFTRIAXONE SODIUM 250 MG IJ SOLR
INTRAMUSCULAR | Status: AC
  Filled 2016-04-01: qty 250

## 2016-04-01 MED ORDER — NAPROXEN 500 MG PO TABS: 500.0000 mg | ORAL_TABLET | Freq: Two times a day (BID) | ORAL | 0 refills | Status: DC

## 2016-04-01 MED ORDER — AZITHROMYCIN 250 MG PO TABS
ORAL_TABLET | ORAL | Status: AC
  Filled 2016-04-01: qty 4

## 2016-04-01 MED ORDER — AZITHROMYCIN 250 MG PO TABS
1000.0000 mg | ORAL_TABLET | Freq: Once | ORAL | Status: AC
  Administered 2016-04-01: 1000 mg via ORAL

## 2016-04-01 MED ORDER — CEFTRIAXONE SODIUM 250 MG IJ SOLR
250.0000 mg | Freq: Once | INTRAMUSCULAR | Status: AC
  Administered 2016-04-01: 250 mg via INTRAMUSCULAR

## 2016-04-01 MED ORDER — CYCLOBENZAPRINE HCL 10 MG PO TABS: 10.0000 mg | ORAL_TABLET | Freq: Every day | ORAL | 0 refills | Status: AC

## 2016-04-01 NOTE — ED Triage Notes (Signed)
Patient presents to [facility], she states that she has been experiencing lower back pain since Friday.

## 2016-04-01 NOTE — Discharge Instructions (Addendum)
Keep appointment to establish care on 04/25/16.   The cause of aching in low back/lower abdomen/fronts of thighs seems most likely to be musculoskeletal.  Prescriptions for naproxen (anti inflammatory) and cyclobenzaprine (muscle relaxer) [facility] [address]. Test results should be available in several days.  Injection of rocephin and oral zithromax were given at urgent care today.  If vaginal discharge persists, might also consider trial of clindamycin gel vaginally, or clindamycin capsules by mouth.

## 2016-04-01 NOTE — ED Provider Notes (Signed)
[place]    CSN: [id number] Arrival date & time: 04/01/16  1522     History   Chief Complaint Chief Complaint  Patient presents with  . Abdominal Pain    HPI Yesenia Simmons is a 45 y.o. female. She presents today with a 2-3 days history of pain in the low back radiating around to the lower abdomen bilaterally. Discomfort comes and goes, and she describes it as sharp. No change in her bowel movements, last bowel movement was normal for her and was yesterday. She tends to go every other day or less often. White discharge chronically, was seen in August and noted to have bacterial vaginosis and trichomonas, which were treated with metronidazole orally. No dysuria. She does have some urinary frequency. No fever. Mild nausea, no vomiting. Malaise, decreased energy. No new activities, no trauma. Legs are achy as well as her back. She has not fallen.  HPI  Past Medical History:  Diagnosis Date  . Asthma   . Hypertension    History reviewed. No pertinent surgical history.   Home Medications    Prior to Admission medications   Medication Sig Start Date End Date Taking? Authorizing Provider  hydrochlorothiazide (HYDRODIURIL) 12.5 MG tablet Take 12.5 mg by mouth daily.   Yes Historical Provider, MD  ibuprofen (ADVIL,MOTRIN) 800 MG tablet Take 800 mg by mouth every 8 (eight) hours as needed.   Yes Historical Provider, MD  montelukast (SINGULAIR) 10 MG tablet Take 10 mg by mouth at bedtime.   Yes Historical Provider, MD  cyclobenzaprine (FLEXERIL) 10 MG tablet Take 1 tablet (10 mg total) by mouth at bedtime. 04/01/16 04/06/16  Sherlene Shams, MD  naproxen (NAPROSYN) 500 MG tablet Take 1 tablet (500 mg total) by mouth 2 (two) times daily. 04/01/16   Sherlene Shams, MD    Family History History reviewed. No pertinent family history.  Social History Social History  Substance Use Topics  . Smoking status: Never Smoker  . Smokeless tobacco: Never Used  . Alcohol use No       Allergies   Review of patient's allergies indicates no known allergies.   Review of Systems Review of Systems  All other systems reviewed and are negative.    Physical Exam Triage Vital Signs ED Triage Vitals  Enc Vitals Group     BP 04/01/16 1608 (!) 134/54     Pulse Rate 04/01/16 1608 71     Resp 04/01/16 1608 12     Temp 04/01/16 1608 98.5 F (36.9 C)     Temp src --      SpO2 04/01/16 1608 100 %     Weight --      Height --      Pain Score 04/01/16 1632 8   Updated Vital Signs BP (!) 134/54 (BP Location: Left Arm) Comment: rn notified  Pulse 71   Temp 98.5 F (36.9 C)   Resp 12   SpO2 100%  Physical Exam  Constitutional: She is oriented to person, place, and time. No distress.  Alert, nicely groomed  HENT:  Head: Atraumatic.  Eyes:  Conjugate gaze, no eye redness/drainage  Neck: Neck supple.  Cardiovascular: Normal rate and regular rhythm.   Pulmonary/Chest: No respiratory distress.  Lungs clear, symmetric breath sounds  Abdominal: Soft. She exhibits no distension and no mass. There is no guarding.  Diffuse mild tenderness to deep palpation, nonfocal  Musculoskeletal: Normal range of motion.  Bilateral upper paralumbar spasm palpable, with discomfort to  palpation  No leg swelling  Neurological: She is alert and oriented to person, place, and time.  Skin: Skin is warm and dry.  No cyanosis  Nursing note and vitals reviewed.    UC Treatments / Results  Labs  Results for orders placed or performed during the hospital encounter of 04/01/16  Glucose, capillary  Result Value Ref Range   Glucose-Capillary 88 65 - 99 mg/dL  POCT urinalysis dip (device)  Result Value Ref Range   Glucose, UA NEGATIVE NEGATIVE mg/dL   Bilirubin Urine NEGATIVE NEGATIVE   Ketones, ur NEGATIVE NEGATIVE mg/dL   Specific Gravity, Urine 1.020 1.005 - 1.030   Hgb urine dipstick NEGATIVE NEGATIVE   pH 7.0 5.0 - 8.0   Protein, ur 30 (A) NEGATIVE mg/dL   Urobilinogen, UA  1.0 0.0 - 1.0 mg/dL   Nitrite NEGATIVE NEGATIVE   Leukocytes, UA NEGATIVE NEGATIVE  Pregnancy, urine POC  Result Value Ref Range   Preg Test, Ur NEGATIVE NEGATIVE    Radiology Dg Abd Acute W/chest  Result Date: 04/01/2016 CLINICAL DATA:  Abdominal pain for the past 1.5 weeks.  Nausea. EXAM: DG ABDOMEN ACUTE W/ 1V CHEST COMPARISON:  None. FINDINGS: Normal sized heart. Clear lungs. Mild diffuse peribronchial thickening and accentuation of the interstitial markings. Normal bowel gas pattern without free peritoneal air. Unremarkable bones. IMPRESSION: 1. No acute abnormality. 2. Mild chronic bronchitic changes. Electronically Signed   By: Claudie Revering M.D.   On: 04/01/2016 18:01    Procedures Procedures (including critical care time)      None today  Final Clinical Impressions(s) / UC Diagnoses   Final diagnoses:  Acute bilateral low back pain without sciatica   Keep appointment to establish care on 04/25/16.   The cause of aching in low back/lower abdomen/fronts of thighs seems most likely to be musculoskeletal.  Prescriptions for naproxen (anti inflammatory) and cyclobenzaprine (muscle relaxer) [facility] [address] Test results should be available in several days.  Injection of rocephin and oral zithromax were given at urgent care today.  If vaginal discharge persists, might also consider trial of clindamycin gel vaginally, or clindamycin capsules by mouth.    New Prescriptions Discharge Medication List as of 04/01/2016  6:38 PM    START taking these medications   Details  cyclobenzaprine (FLEXERIL) 10 MG tablet Take 1 tablet (10 mg total) by mouth at bedtime., Starting Mon 04/01/2016, Until Sat 04/06/2016, Normal    Naproxen 500mg  bid     Sherlene Shams, MD 04/02/16 1221

## 2016-04-02 LAB — URINE CYTOLOGY ANCILLARY ONLY
Chlamydia: NEGATIVE
NEISSERIA GONORRHEA: NEGATIVE

## 2016-04-04 LAB — URINE CYTOLOGY ANCILLARY ONLY

## 2016-04-05 ENCOUNTER — Telehealth (HOSPITAL_COMMUNITY): Payer: Self-pay | Admitting: Internal Medicine

## 2016-04-05 NOTE — Telephone Encounter (Signed)
Please let patient know that test for gardnerella (bacterial vaginosis) was positive.   If having vaginal irritation/discharge, would send rx for metronidazole 500mg  bid x 7d #14 no refills.   Recheck for further evaluation if symptoms persist.  LM

## 2016-04-09 NOTE — Telephone Encounter (Signed)
Called pt and notified of recent lab results from visit 8/24 Also gave lab results from visit on 10/2 Pt ID'd properly... Reports feeling better and sx have subsided Adv pt if sx are not getting better to return or to f/u w/PCP Education on safe sex given Also adv pt to notify partner(s) Faxed documentation to [facility] Pt verb understanding.

## 2016-04-09 NOTE — Telephone Encounter (Signed)
Called pt and notified of recent lab results Pt ID'd properly... Reports feeling better and sx have subsided and there is no need for further tx Adv pt if sx are not getting better to return or to f/u w/PCP Pt verb understanding.

## 2016-04-22 ENCOUNTER — Encounter: Payer: Self-pay | Admitting: Obstetrics and Gynecology

## 2016-06-02 ENCOUNTER — Encounter (HOSPITAL_COMMUNITY): Payer: Self-pay | Admitting: Emergency Medicine

## 2016-06-02 ENCOUNTER — Emergency Department (HOSPITAL_COMMUNITY)
Admission: EM | Admit: 2016-06-02 | Discharge: 2016-06-03 | Disposition: A | Discharge status: Home / Self Care | Payer: Medicaid Other | Attending: Emergency Medicine | Admitting: Emergency Medicine

## 2016-06-02 DIAGNOSIS — B9689 Other specified bacterial agents as the cause of diseases classified elsewhere: Secondary | ICD-10-CM | POA: Diagnosis not present

## 2016-06-02 DIAGNOSIS — N760 Acute vaginitis: Secondary | ICD-10-CM | POA: Insufficient documentation

## 2016-06-02 DIAGNOSIS — I10 Essential (primary) hypertension: Secondary | ICD-10-CM | POA: Insufficient documentation

## 2016-06-02 DIAGNOSIS — Z79899 Other long term (current) drug therapy: Secondary | ICD-10-CM | POA: Diagnosis not present

## 2016-06-02 DIAGNOSIS — J45909 Unspecified asthma, uncomplicated: Secondary | ICD-10-CM | POA: Diagnosis not present

## 2016-06-02 DIAGNOSIS — N939 Abnormal uterine and vaginal bleeding, unspecified: Secondary | ICD-10-CM | POA: Diagnosis present

## 2016-06-02 LAB — URINALYSIS, ROUTINE W REFLEX MICROSCOPIC
BILIRUBIN URINE: NEGATIVE
Glucose, UA: NEGATIVE mg/dL
KETONES UR: NEGATIVE mg/dL
NITRITE: NEGATIVE
Protein, ur: NEGATIVE mg/dL
SPECIFIC GRAVITY, URINE: 1.036 — AB (ref 1.005–1.030)
pH: 6 (ref 5.0–8.0)

## 2016-06-02 LAB — URINE MICROSCOPIC-ADD ON: BACTERIA UA: NONE SEEN

## 2016-06-02 LAB — WET PREP, GENITAL
SPERM: NONE SEEN
TRICH WET PREP: NONE SEEN
YEAST WET PREP: NONE SEEN

## 2016-06-02 LAB — POC URINE PREG, ED: Preg Test, Ur: NEGATIVE

## 2016-06-02 MED ORDER — NAPROXEN 500 MG PO TABS
500.0000 mg | ORAL_TABLET | Freq: Once | ORAL | Status: AC
  Administered 2016-06-03: 500 mg via ORAL
  Filled 2016-06-02: qty 1

## 2016-06-02 NOTE — ED Triage Notes (Signed)
Pt from home with complaints of vaginal bleeding x 2 weeks and generalized aches. Pt also has complaints of white thick vaginal discharge that began yesterday. Pt states she has had nausea and lower abdominal pain as well. Pt denies diarrhea and urinary symptoms. Pt states she sometimes has lightheadedness, but denies currently. Pt states she has used 10 pads today and she states she has small clots.

## 2016-06-02 NOTE — ED Provider Notes (Signed)
[facility] DEPT Provider Note   CSN: [id number] Arrival date & time: 06/02/16  2228 By signing my name below, I, Georgette Shell, attest that this documentation has been prepared under the direction and in the presence of Larkyn Greenberger, MD. Electronically Signed: Georgette Shell, ED Scribe. 06/02/16. 11:21 PM.  History   Chief Complaint Chief Complaint  Patient presents with  . Vaginal Bleeding  . Generalized Body Aches   HPI Comments: Yesenia Simmons is a 45 y.o. female with h/o HTN who presents to the Emergency Department complaining of vaginal bleeding onset 2 weeks ago, worsening this week. Pt also has associated generalized myalgias/arthralgias, cramping abdominal pain, bilateral feet swelling, intermittent light-headedness, and nausea. Pt notes she's had some thick, white vaginal discharge that began yesterday. She states she has used ten pads today and has had small clots in her blood. No alleviating factors noted. Per pt, she has not had a regular period in one year. Denies having any new sexual partners. She further denies fever, chills, dysuria, diarrhea, constipation, or any other associated symptoms.   The history is provided by the patient. No language interpreter was used.  Vaginal Bleeding  Primary symptoms include vaginal bleeding.  Primary symptoms include no dysuria. There has been no fever. This is a new problem. The current episode started more than 1 week ago. The problem occurs constantly. The problem has not changed since onset.The symptoms occur spontaneously. She is not pregnant. She has missed her period. Her LMP was months ago. The patient's menstrual history has been irregular. The discharge was white and thick. Pertinent negatives include no anorexia and no diarrhea. She has tried nothing for the symptoms. Sexual activity: non-contributory. There is no concern regarding sexually transmitted diseases. She uses nothing for contraception. Associated medical issues do not  include STD.    Past Medical History:  Diagnosis Date  . Asthma   . Hypertension     There are no active problems to display for this patient.   History reviewed. No pertinent surgical history.  OB History    No data available       Home Medications    Prior to Admission medications   Medication Sig Start Date End Date Taking? Authorizing Provider  hydrochlorothiazide (HYDRODIURIL) 12.5 MG tablet Take 12.5 mg by mouth daily.    Historical Provider, MD  ibuprofen (ADVIL,MOTRIN) 800 MG tablet Take 800 mg by mouth every 8 (eight) hours as needed.    Historical Provider, MD  montelukast (SINGULAIR) 10 MG tablet Take 10 mg by mouth at bedtime.    Historical Provider, MD  naproxen (NAPROSYN) 500 MG tablet Take 1 tablet (500 mg total) by mouth 2 (two) times daily. 04/01/16   Sherlene Shams, MD    Family History No family history on file.  Social History Social History  Substance Use Topics  . Smoking status: Never Smoker  . Smokeless tobacco: Never Used  . Alcohol use No     Allergies   Patient has no known allergies. Review of Systems Review of Systems  Constitutional: Negative for chills and fever.  Gastrointestinal: Negative for anorexia and diarrhea.  Genitourinary: Positive for vaginal bleeding and vaginal discharge. Negative for dysuria.  Musculoskeletal: Positive for arthralgias and myalgias.  All other systems reviewed and are negative.   Physical Exam Updated Vital Signs BP 130/80 (BP Location: Left Arm)   Pulse 72   Temp 98.6 F (37 C) (Oral)   Resp 20   Ht 5\' 1"  (1.549 m)  Wt 180 lb (81.6 kg)   SpO2 99%   BMI 34.01 kg/m   Physical Exam  Constitutional: She appears well-developed and well-nourished.  HENT:  Head: Normocephalic.  Mouth/Throat: Oropharynx is clear and moist. No oropharyngeal exudate.  Eyes: Conjunctivae and EOM are normal. Pupils are equal, round, and reactive to light. Right eye exhibits no discharge. Left eye exhibits no  discharge. No scleral icterus.  Neck: Normal range of motion. Neck supple. No JVD present. No tracheal deviation present.  Trachea is midline. No stridor or carotid bruits.   Cardiovascular: Normal rate, regular rhythm, normal heart sounds and intact distal pulses.   No murmur heard. Pulmonary/Chest: Effort normal and breath sounds normal. No stridor. No respiratory distress. She has no wheezes. She has no rales.  Lungs CTA bilaterally.  Abdominal: Soft. Bowel sounds are normal. She exhibits no distension and no mass. There is no tenderness. There is no rebound and no guarding.  Genitourinary: Cervix exhibits no motion tenderness. Right adnexum displays no tenderness. Left adnexum displays no tenderness. No vaginal discharge found.  Genitourinary Comments: Chaperone present throughout entire exam. Clear vaginal discharge.  Musculoskeletal: Normal range of motion. She exhibits no edema or tenderness.  All compartments are soft. No palpable cords. Intact distal pulses and DP bilaterally.  Lymphadenopathy:    She has no cervical adenopathy.  Neurological: She is alert. She has normal reflexes. She displays normal reflexes. She exhibits normal muscle tone.  Skin: Skin is warm and dry. Capillary refill takes less than 2 seconds.  Psychiatric: She has a normal mood and affect. Her behavior is normal.  Nursing note and vitals reviewed.    ED Treatments / Results  DIAGNOSTIC STUDIES: Oxygen Saturation is 99% on RA, normal by my interpretation.    COORDINATION OF CARE: 11:21 PM Discussed treatment plan with pt at bedside which includes OB-GYN referral and pt agreed to plan.  Results for orders placed or performed during the hospital encounter of 06/02/16  Wet prep, genital  Result Value Ref Range   Yeast Wet Prep HPF POC NONE SEEN NONE SEEN   Trich, Wet Prep NONE SEEN NONE SEEN   Clue Cells Wet Prep HPF POC PRESENT (A) NONE SEEN   WBC, Wet Prep HPF POC FEW (A) NONE SEEN   Sperm NONE SEEN     Urinalysis, Routine w reflex microscopic (not at [facility])  Result Value Ref Range   Color, Urine YELLOW YELLOW   APPearance CLOUDY (A) CLEAR   Specific Gravity, Urine 1.036 (H) 1.005 - 1.030   pH 6.0 5.0 - 8.0   Glucose, UA NEGATIVE NEGATIVE mg/dL   Hgb urine dipstick LARGE (A) NEGATIVE   Bilirubin Urine NEGATIVE NEGATIVE   Ketones, ur NEGATIVE NEGATIVE mg/dL   Protein, ur NEGATIVE NEGATIVE mg/dL   Nitrite NEGATIVE NEGATIVE   Leukocytes, UA TRACE (A) NEGATIVE  Urine microscopic-add on  Result Value Ref Range   Squamous Epithelial / LPF 0-5 (A) NONE SEEN   WBC, UA 0-5 0 - 5 WBC/hpf   RBC / HPF TOO NUMEROUS TO COUNT 0 - 5 RBC/hpf   Bacteria, UA NONE SEEN NONE SEEN   Urine-Other MUCOUS PRESENT   POC Urine Pregnancy, ED (do NOT order at [facility])  Result Value Ref Range   Preg Test, Ur NEGATIVE NEGATIVE   No results found.  Procedures Procedures (including critical care time)  Medications Ordered in ED  Medications  naproxen (NAPROSYN) tablet 500 mg (not administered)  metroNIDAZOLE (FLAGYL) tablet 500 mg (not administered)  Final Clinical Impressions(s) / ED Diagnoses   All questions answered to patient's satisfaction. Based on history and exam patient has been appropriately medically screened and emergency conditions excluded. Patient is stable for discharge at this time. Follow up with your PMD for recheck in 2 days and strict return precautions given.   Will treat for BV and refer to GYN for pap smear and to establish care   I personally performed the services described in this documentation, which was scribed in my presence. The recorded information has been reviewed and is accurate.       Veatrice Kells, MD 06/03/16 [phone]

## 2016-06-03 ENCOUNTER — Encounter (HOSPITAL_COMMUNITY): Payer: Self-pay | Admitting: Emergency Medicine

## 2016-06-03 LAB — GC/CHLAMYDIA PROBE AMP (~~LOC~~) NOT AT ARMC
CHLAMYDIA, DNA PROBE: NEGATIVE
NEISSERIA GONORRHEA: NEGATIVE

## 2016-06-03 MED ORDER — NAPROXEN 375 MG PO TABS: 375.0000 mg | ORAL_TABLET | Freq: Two times a day (BID) | ORAL | 0 refills | Status: DC

## 2016-06-03 MED ORDER — METRONIDAZOLE 500 MG PO TABS
500.0000 mg | ORAL_TABLET | Freq: Once | ORAL | Status: AC
  Administered 2016-06-03: 500 mg via ORAL
  Filled 2016-06-03: qty 1

## 2016-06-03 MED ORDER — METRONIDAZOLE 500 MG PO TABS: 500.0000 mg | ORAL_TABLET | Freq: Two times a day (BID) | ORAL | 0 refills | Status: DC

## 2016-07-03 ENCOUNTER — Ambulatory Visit (INDEPENDENT_AMBULATORY_CARE_PROVIDER_SITE_OTHER): Payer: Medicaid Other | Admitting: Obstetrics & Gynecology

## 2016-07-03 ENCOUNTER — Encounter: Payer: Self-pay | Admitting: Obstetrics & Gynecology

## 2016-07-03 VITALS — BP 126/71 | HR 74 | Ht 61.0 in | Wt 183.0 lb

## 2016-07-03 DIAGNOSIS — Z3202 Encounter for pregnancy test, result negative: Secondary | ICD-10-CM | POA: Diagnosis not present

## 2016-07-03 DIAGNOSIS — N939 Abnormal uterine and vaginal bleeding, unspecified: Secondary | ICD-10-CM

## 2016-07-03 LAB — POCT URINE PREGNANCY: PREG TEST UR: NEGATIVE

## 2016-07-03 NOTE — Progress Notes (Signed)
History:  46 y.o. ID:[id number] here today for eval of her cycle. Pt report menses all Nov.  Pt was followed in [facility].  She is s/p [place] in 2017.  She was told that she had fibroids. At that time she was having pain.   Pt reports pain that is intermittent on her right side. She was seen the ED in Nov. She was dx with BV.    Pt last PAP was over 1 year prev.  Pt denies irreg discharge at present.  NO pain today.  The following portions of the patient's history were reviewed and updated as appropriate: allergies, current medications, past family history, past medical history, past social history, past surgical history and problem list.  Review of Systems:  Pertinent items are noted in HPI.   Objective:  Physical Exam Blood pressure 126/71, pulse 74, height 5\' 1"  (1.549 m), weight 183 lb (83 kg), last menstrual period 05/31/2016. BP 126/71 (BP Location: Left Arm, Patient Position: Sitting)   Pulse 74   Ht 5\' 1"  (1.549 m)   Wt 183 lb (83 kg)   LMP 05/31/2016 (Approximate) Comment: Lasted for two weeks; had period whole month of Nov  BMI 34.58 kg/m  CONSTITUTIONAL: Well-developed, well-nourished female in no acute distress.  HENT:  Normocephalic, atraumatic EYES: Conjunctivae and EOM are normal. No scleral icterus.  NECK: Normal range of motion SKIN: Skin is warm and dry. No rash noted. Not diaphoretic.No pallor. [place]: Alert and oriented to person, place, and time. Normal coordination.  Abd: Soft, nontender and nondistended; mass effect palpated in lower abd Pelvic: Normal appearing external genitalia; normal appearing vaginal mucosa and cervix.  Normal discharge. Uterus 14-16 weeks difficult to assess due to body habitus. No uterine or adnexal tenderness  The indications for endometrial biopsy were reviewed.   Risks of the biopsy including cramping, bleeding, infection, uterine perforation, inadequate specimen and need for additional procedures  were discussed. The patient states she  understands and agrees to undergo procedure today. Consent was signed. Time out was performed. Urine HCG was negative. A sterile speculum was placed in the patient's vagina and the cervix was prepped with Betadine. A single-toothed tenaculum was placed on the anterior lip of the cervix to stabilize it. The 3 mm pipelle was introduced into the endometrial cavity without difficulty to a depth of 12cm, and a moderate amount of tissue was obtained and sent to pathology. The instruments were removed from the patient's vagina. Minimal bleeding from the cervix was noted. The patient tolerated the procedure well. Routine post-procedure instructions were given to the patient. The patient will follow up to review the results and for further management.      Labs and Imaging Results needed from [facility]  Assessment & Plan:  AUB thought due to uterine fibroids.  Need [place] results for [employer]  F/u for management plan and review of bx results F/u results of endo bx F/u in 2 weeks  Dez Stauffer L. Harraway-Smith, M.D., Cherlynn June

## 2016-07-03 NOTE — Patient Instructions (Signed)
Abnormal Uterine Bleeding Abnormal uterine bleeding can affect women at various stages in life, including teenagers, women in their reproductive years, pregnant women, and women who have reached menopause. Several kinds of uterine bleeding are considered abnormal, including:  Bleeding or spotting between periods.  Bleeding after sexual intercourse.  Bleeding that is heavier or more than normal.  Periods that last longer than usual.  Bleeding after menopause. Many cases of abnormal uterine bleeding are minor and simple to treat, while others are more serious. Any type of abnormal bleeding should be evaluated by your health care provider. Treatment will depend on the cause of the bleeding. Follow these instructions at home: Monitor your condition for any changes. The following actions may help to alleviate any discomfort you are experiencing:  Avoid the use of tampons and douches as directed by your health care provider.  Change your pads frequently. You should get regular pelvic exams and Pap tests. Keep all follow-up appointments for diagnostic tests as directed by your health care provider. Contact a health care provider if:  Your bleeding lasts more than 1 week.  You feel dizzy at times. Get help right away if:  You pass out.  You are changing pads every 15 to 30 minutes.  You have abdominal pain.  You have a fever.  You become sweaty or weak.  You are passing large blood clots from the vagina.  You start to feel nauseous and vomit. This information is not intended to replace advice given to you by your health care provider. Make sure you discuss any questions you have with your health care provider. Document Released: 06/17/2005 Document Revised: 11/29/2015 Document Reviewed: 01/14/2013 Elsevier Interactive Patient Education  2017 Elsevier Inc.  

## 2016-07-04 ENCOUNTER — Telehealth: Payer: Self-pay

## 2016-07-04 NOTE — Telephone Encounter (Signed)
Patient called and states that the medication she was taking was  Norethindrone 5 mg (aygestin)daily for her bleeding. Kathrene Alu RNBSN

## 2016-07-08 ENCOUNTER — Telehealth: Payer: Self-pay

## 2016-07-08 NOTE — Telephone Encounter (Signed)
-----   Message from Lavonia Drafts, MD sent at 07/08/2016  1:09 PM EST ----- Please call pt. Her endo bx was neg.  We are still waiting for the [place] from her prev provider.  Thx, clh-s

## 2016-07-08 NOTE — Telephone Encounter (Signed)
Patient called and made aware that endometrail biopsy was negative and we are still awaiting results from her previous doctor. Kathrene Alu RNBSN

## 2016-07-18 ENCOUNTER — Ambulatory Visit: Payer: Medicaid Other | Admitting: Obstetrics & Gynecology

## 2016-08-07 ENCOUNTER — Encounter: Payer: Self-pay | Admitting: Obstetrics & Gynecology

## 2016-08-07 ENCOUNTER — Ambulatory Visit (INDEPENDENT_AMBULATORY_CARE_PROVIDER_SITE_OTHER): Payer: Medicaid Other | Admitting: Obstetrics & Gynecology

## 2016-08-07 VITALS — BP 140/65 | HR 68 | Ht 61.0 in | Wt 181.0 lb

## 2016-08-07 DIAGNOSIS — D251 Intramural leiomyoma of uterus: Secondary | ICD-10-CM | POA: Diagnosis not present

## 2016-08-07 DIAGNOSIS — N939 Abnormal uterine and vaginal bleeding, unspecified: Secondary | ICD-10-CM | POA: Diagnosis not present

## 2016-08-07 MED ORDER — MEGESTROL ACETATE 40 MG PO TABS: 40.0000 mg | ORAL_TABLET | Freq: Two times a day (BID) | ORAL | 2 refills | Status: DC

## 2016-08-07 NOTE — Progress Notes (Signed)
History:  46 y.o. ID:[id number] here today for eval of AUB. Pt was scheduled for a TVH with her prev GYN then she moved to [place].    The following portions of the patient's history were reviewed and updated as appropriate: allergies, current medications, past family history, past medical history, past social history, past surgical history and problem list.  Review of Systems:  Pertinent items are noted in HPI.   Objective:  Physical Exam BP 140/65 (BP Location: Left Arm, Patient Position: Sitting)   Pulse 68   Ht 5\' 1"  (1.549 m)   Wt 181 lb (82.1 kg)   LMP 08/04/2016 Comment: patient states bleeding is "spotting"  BMI 34.20 kg/m  BP 140/65 (BP Location: Left Arm, Patient Position: Sitting)   Pulse 68   Ht 5\' 1"  (1.549 m)   Wt 181 lb (82.1 kg)   LMP 08/04/2016 Comment: patient states bleeding is "spotting"  BMI 34.20 kg/m  CONSTITUTIONAL: Well-developed, well-nourished female in no acute distress.  HENT:  Normocephalic, atraumatic EYES: Conjunctivae and EOM are normal. No scleral icterus.  NECK: Normal range of motion SKIN: Skin is warm and dry. No rash noted. Not diaphoretic.No pallor. [place]: Alert and oriented to person, place, and time. Normal coordination.    Labs and Imaging 07/03/2016 Diagnosis Endometrium, biopsy - INACTIVE ENDOMETRIUM AND BENIGN ENDOCERVIX. - NO HYPERPLASIA OR MALIGNANCY.  Assessment & Plan:  AUB Uterine fibroids Megace 40mg  per day F/u for LnIUD after reading info vs TVH  Total face-to-face time with patient was 15 min.  Greater than 50% was spent in counseling and coordination of care with the patient. We discussed methods of tx    Clara Smolen L. Harraway-Smith, M.D., Cherlynn June

## 2016-08-07 NOTE — Patient Instructions (Signed)

## 2016-08-08 ENCOUNTER — Telehealth: Payer: Self-pay

## 2016-08-08 NOTE — Telephone Encounter (Signed)
Patient called and states that she would like to proceed with surgery. Will route to provider so scheduling can be started. Kathrene Alu RN

## 2016-08-09 ENCOUNTER — Telehealth: Payer: Self-pay | Admitting: Obstetrics & Gynecology

## 2016-08-09 ENCOUNTER — Encounter: Payer: Self-pay | Admitting: Obstetrics & Gynecology

## 2016-08-09 NOTE — Telephone Encounter (Signed)
Returned call to pt. Pt is requesting TVh vs LnIUD. She had a PAP last year which was WNL. She will have the results faxed to our ofc. She will be out of town in [place] and wants surgery the first week of April. She is aware that I will be out the 2nd and 3rd week of April and the she will have to see one of my partners for her 2 week f/u visit.  She is also aware that she will need to come in for e preop visit to discuss the details of surgery.  Haidynn Almendarez L. Harraway-Smith, M.D., Cherlynn June

## 2016-08-12 ENCOUNTER — Encounter (HOSPITAL_COMMUNITY): Payer: Self-pay

## 2016-08-29 ENCOUNTER — Other Ambulatory Visit: Payer: Self-pay | Admitting: Obstetrics & Gynecology

## 2016-09-02 ENCOUNTER — Ambulatory Visit (INDEPENDENT_AMBULATORY_CARE_PROVIDER_SITE_OTHER): Payer: Medicaid Other | Admitting: Obstetrics & Gynecology

## 2016-09-02 ENCOUNTER — Other Ambulatory Visit (HOSPITAL_COMMUNITY)
Admission: RE | Admit: 2016-09-02 | Discharge: 2016-09-02 | Disposition: A | Discharge status: Home / Self Care | Payer: Medicaid Other | Source: Ambulatory Visit | Attending: Obstetrics & Gynecology | Admitting: Obstetrics & Gynecology

## 2016-09-02 ENCOUNTER — Encounter: Payer: Self-pay | Admitting: Obstetrics & Gynecology

## 2016-09-02 VITALS — BP 107/59 | HR 81 | Ht 61.0 in | Wt 174.0 lb

## 2016-09-02 DIAGNOSIS — Z1151 Encounter for screening for human papillomavirus (HPV): Secondary | ICD-10-CM | POA: Diagnosis not present

## 2016-09-02 DIAGNOSIS — Z113 Encounter for screening for infections with a predominantly sexual mode of transmission: Secondary | ICD-10-CM | POA: Insufficient documentation

## 2016-09-02 DIAGNOSIS — N939 Abnormal uterine and vaginal bleeding, unspecified: Secondary | ICD-10-CM

## 2016-09-02 DIAGNOSIS — Z124 Encounter for screening for malignant neoplasm of cervix: Secondary | ICD-10-CM

## 2016-09-02 DIAGNOSIS — Z01419 Encounter for gynecological examination (general) (routine) without abnormal findings: Secondary | ICD-10-CM | POA: Diagnosis present

## 2016-09-02 NOTE — Progress Notes (Signed)
History:  46 y.o. Yesenia Simmons here today for PAP and preop visit.  She reports that her bleeding has been controlled on the Aygestin. She did not start the Megace. She does not want to remain on long term meds and wishes to proceed with surgery.  She will not be working during the time following the surgery. She had an US done at an outside facilty. She had a neg endo bx 07/03/2016  The following portions of the patient's history were reviewed and updated as appropriate: allergies, current medications, past family history, past medical history, past social history, past surgical history and problem list.  Review of Systems:  Pertinent items are noted in HPI.   Objective:  Physical Exam Blood pressure (!) 107/59, pulse 81, height 5\' 1"  (1.549 m), weight 174 lb (78.9 kg), last menstrual period 08/04/2016. BP (!) 107/59   Pulse 81   Ht 5\' 1"  (1.549 m)   Wt 174 lb (78.9 kg)   LMP 08/04/2016 Comment: patient states bleeding is "spotting"  BMI 32.88 kg/m  CONSTITUTIONAL: Well-developed, well-nourished female in no acute distress.  HENT:  Normocephalic, atraumatic EYES: Conjunctivae and EOM are normal. No scleral icterus.  NECK: Normal range of motion SKIN: Skin is warm and dry. No rash noted. Not diaphoretic.No pallor. [place]: Alert and oriented to person, place, and time. Normal coordination.  Abd: Soft, nontender and nondistended Pelvic: Normal appearing external genitalia; normal appearing vaginal mucosa and cervix.  Normal discharge.  8 weeks sized uterus; no other palpable masses, no uterine or adnexal tenderness. Pt has good descensus.    Assessment & Plan:  AUB. Currently controlled with Aygestin. Pt declined endometrial ablation and will proceed with TVH with bilateral salpingectomy.  Patient desires surgical management with TVH with bilateral salpingectomy.  The risks of surgery were discussed in detail with the patient including but not limited to: bleeding which may require  transfusion or reoperation; infection which may require prolonged hospitalization or re-hospitalization and antibiotic therapy; injury to bowel, bladder, ureters and major vessels or other surrounding organs; need for additional procedures including laparotomy; thromboembolic phenomenon, incisional problems and other postoperative or anesthesia complications.  Patient was told that the likelihood that her condition and symptoms will be treated effectively with this surgical management was very high; the postoperative expectations were also discussed in detail. The patient also understands the alternative treatment options which were discussed in full. All questions were answered.  Routine preoperative instructions of having nothing to eat or drink after midnight on the day prior to surgery and also coming to the hospital 1 1/2 hours prior to her time of surgery were emphasized.  She has a preoperative appointment scheduled and will be given further preoperative instructions at that visit. Printed patient education handouts about the procedure were given to the patient to review at home.  Trayveon Beckford L. Harraway-Smith, M.D., Cherlynn June

## 2016-09-02 NOTE — Patient Instructions (Signed)
Vaginal Hysterectomy, Care After °Refer to this sheet in the next few weeks. These instructions provide you with information about caring for yourself after your procedure. Your health care provider may also give you more specific instructions. Your treatment has been planned according to current medical practices, but problems sometimes occur. Call your health care provider if you have any problems or questions after your procedure. °What can I expect after the procedure? °After the procedure, it is common to have: °· Pain. °· Soreness and numbness in your incision areas. °· Vaginal bleeding and discharge. °· Constipation. °· Temporary problems emptying the bladder. °· Feelings of sadness or other emotions. °Follow these instructions at home: °Medicines  °· Take over-the-counter and prescription medicines only as told by your health care provider. °· If you were prescribed an antibiotic medicine, take it as told by your health care provider. Do not stop taking the antibiotic even if you start to feel better. °· Do not drive or operate heavy machinery while taking prescription pain medicine. °Activity  °· Return to your normal activities as told by your health care provider. Ask your health care provider what activities are safe for you. °· Get regular exercise as told by your health care provider. You may be told to take short walks every day and go farther each time. °· Do not lift anything that is heavier than 10 lb (4.5 kg). °General instructions  ° °· Do not put anything in your vagina for 6 weeks after your surgery or as told by your health care provider. This includes tampons and douches. °· Do not have sex until your health care provider says you can. °· Do not take baths, swim, or use a hot tub until your health care provider approves. °· Drink enough fluid to keep your urine clear or pale yellow. °· Do not drive for 24 hours if you were given a sedative. °· Keep all follow-up visits as told by your health  care provider. This is important. °Contact a health care provider if: °· Your pain medicine is not helping. °· You have a fever. °· You have redness, swelling, or pain at your incision site. °· You have blood, pus, or a bad-smelling discharge from your vagina. °· You continue to have difficulty urinating. °Get help right away if: °· You have severe abdominal or back pain. °· You have heavy bleeding from your vagina. °· You have chest pain or shortness of breath. °This information is not intended to replace advice given to you by your health care provider. Make sure you discuss any questions you have with your health care provider. °Document Released: 10/09/2015 Document Revised: 11/23/2015 Document Reviewed: 07/02/2015 °Elsevier Interactive Patient Education © 2017 Elsevier Inc. ° °

## 2016-09-06 LAB — CYTOLOGY - PAP
BACTERIAL VAGINITIS: POSITIVE — AB
CHLAMYDIA, DNA PROBE: NEGATIVE
Candida vaginitis: NEGATIVE
DIAGNOSIS: NEGATIVE
HPV: NOT DETECTED
NEISSERIA GONORRHEA: NEGATIVE

## 2016-09-12 ENCOUNTER — Telehealth: Payer: Self-pay

## 2016-09-12 NOTE — Telephone Encounter (Signed)
Patient would like to reschedule her surgery to some time after April 20th due to family/friend sickness. Kathrene Alu RNBSN

## 2016-09-16 ENCOUNTER — Inpatient Hospital Stay (HOSPITAL_COMMUNITY): Admission: RE | Admit: 2016-09-16 | Payer: Medicaid Other | Source: Ambulatory Visit

## 2016-09-17 ENCOUNTER — Encounter (HOSPITAL_COMMUNITY): Payer: Self-pay

## 2016-09-17 ENCOUNTER — Telehealth: Payer: Self-pay

## 2016-09-17 NOTE — Telephone Encounter (Signed)
Patient called and made aware of new surgery date of 11-26-16. Patient made aware that she will receive some more information in the mail regarding her surgery.Kathrene Alu RNBSN

## 2016-09-18 ENCOUNTER — Other Ambulatory Visit: Payer: Self-pay | Admitting: Obstetrics & Gynecology

## 2016-09-18 DIAGNOSIS — N760 Acute vaginitis: Principal | ICD-10-CM

## 2016-09-18 DIAGNOSIS — B9689 Other specified bacterial agents as the cause of diseases classified elsewhere: Secondary | ICD-10-CM

## 2016-09-18 MED ORDER — METRONIDAZOLE 500 MG PO TABS: 500.0000 mg | ORAL_TABLET | Freq: Two times a day (BID) | ORAL | 0 refills | Status: DC

## 2016-09-19 ENCOUNTER — Telehealth: Payer: Self-pay

## 2016-09-19 ENCOUNTER — Ambulatory Visit (HOSPITAL_COMMUNITY)
Admission: RE | Admit: 2016-09-19 | Payer: Medicaid Other | Source: Ambulatory Visit | Admitting: Obstetrics & Gynecology

## 2016-09-19 ENCOUNTER — Encounter (HOSPITAL_COMMUNITY): Admission: RE | Payer: Self-pay | Source: Ambulatory Visit

## 2016-09-19 SURGERY — HYSTERECTOMY, VAGINAL
Anesthesia: Choice | Site: Vagina

## 2016-09-19 NOTE — Telephone Encounter (Signed)
Left message for patient to return call to office.   Patient has BV and her prescription for Flagyl has been sent to her pharmacy. Kathrene Alu RNBSN

## 2016-09-20 ENCOUNTER — Telehealth: Payer: Self-pay

## 2016-09-20 ENCOUNTER — Other Ambulatory Visit: Payer: Self-pay | Admitting: Obstetrics & Gynecology

## 2016-09-20 DIAGNOSIS — N939 Abnormal uterine and vaginal bleeding, unspecified: Secondary | ICD-10-CM

## 2016-09-20 MED ORDER — MEGESTROL ACETATE 40 MG PO TABS: 40.0000 mg | ORAL_TABLET | Freq: Two times a day (BID) | ORAL | 2 refills | Status: DC

## 2016-09-20 NOTE — Telephone Encounter (Signed)
Yesenia Simmons called stating she started bleeding this morning. Patient states the plan was for her to call the office if she started bleeding again before surgery. Told patient I will route message to Dr. Ihor Dow to let her know Patient states that she got out of the shower this morning and it started- when asked how heavy she said not much now but it usually gets worse.Yesenia Simmons

## 2016-09-23 NOTE — Telephone Encounter (Signed)
-----   Message from Lavonia Drafts, MD sent at 09/20/2016 11:45 AM EDT ----- Pt can take Megace 40mg  bid.  Thx, clh-S

## 2016-09-23 NOTE — Telephone Encounter (Signed)
attmepted to reach patient to make her aware that we have called in the Megace 40mg  to her pharmacy for her to take for the bleeding. Kathrene Alu RNBSN

## 2016-10-07 ENCOUNTER — Encounter: Payer: Medicaid Other | Admitting: Obstetrics & Gynecology

## 2016-11-18 ENCOUNTER — Encounter (HOSPITAL_COMMUNITY): Payer: Self-pay

## 2016-11-18 NOTE — Patient Instructions (Signed)
Your procedure is scheduled on:  Tuesday, Nov 26, 2016  Enter through the [employer] of [facility] at:  9:00 AM  Pick up the phone at the desk and dial [phone].  Call this number if you have problems the morning of surgery: [phone].  Remember: Do NOT eat food or drink after:  Midnight Monday  Take these medicines the morning of surgery with a SIP OF WATER:  None  Bring Asthma Inhaler day of surgery  Stop ALL herbal medications at this time  Do NOT smoke the day of surgery.  Do NOT wear jewelry (body piercing), metal hair clips/bobby pins, make-up, or nail polish. Do NOT wear lotions, powders, or perfumes.  You may wear deodorant. Do NOT shave for 48 hours prior to surgery. Do NOT bring valuables to the hospital. Contacts, dentures, or bridgework may not be worn into surgery.  Have a responsible adult drive you home and stay with you for 24 hours after your procedure  Bring a copy of your healthcare power of attorney and living will documents.

## 2016-11-19 ENCOUNTER — Inpatient Hospital Stay (HOSPITAL_COMMUNITY)
Admission: RE | Admit: 2016-11-19 | Discharge: 2016-11-19 | Disposition: A | Discharge status: Home / Self Care | Payer: Medicaid Other | Source: Ambulatory Visit

## 2016-11-19 HISTORY — DX: Tachycardia, unspecified: R00.0

## 2016-11-19 HISTORY — DX: Unspecified right bundle-branch block: I45.10

## 2016-11-26 ENCOUNTER — Encounter (HOSPITAL_COMMUNITY): Admission: RE | Payer: Self-pay | Source: Ambulatory Visit

## 2016-11-26 ENCOUNTER — Inpatient Hospital Stay (HOSPITAL_COMMUNITY)
Admission: RE | Admit: 2016-11-26 | Payer: Medicaid Other | Source: Ambulatory Visit | Admitting: Obstetrics & Gynecology

## 2016-11-26 SURGERY — HYSTERECTOMY, VAGINAL
Anesthesia: Choice | Laterality: Bilateral

## 2016-12-02 ENCOUNTER — Emergency Department (HOSPITAL_BASED_OUTPATIENT_CLINIC_OR_DEPARTMENT_OTHER)
Admission: EM | Admit: 2016-12-02 | Discharge: 2016-12-02 | Disposition: A | Discharge status: Home / Self Care | Payer: Medicaid Other | Attending: Emergency Medicine | Admitting: Emergency Medicine

## 2016-12-02 ENCOUNTER — Encounter (HOSPITAL_BASED_OUTPATIENT_CLINIC_OR_DEPARTMENT_OTHER): Payer: Self-pay | Admitting: *Deleted

## 2016-12-02 ENCOUNTER — Ambulatory Visit: Payer: Medicaid Other | Admitting: Obstetrics & Gynecology

## 2016-12-02 DIAGNOSIS — N760 Acute vaginitis: Secondary | ICD-10-CM | POA: Diagnosis not present

## 2016-12-02 DIAGNOSIS — J45909 Unspecified asthma, uncomplicated: Secondary | ICD-10-CM | POA: Insufficient documentation

## 2016-12-02 DIAGNOSIS — M545 Low back pain, unspecified: Secondary | ICD-10-CM

## 2016-12-02 DIAGNOSIS — I10 Essential (primary) hypertension: Secondary | ICD-10-CM | POA: Insufficient documentation

## 2016-12-02 DIAGNOSIS — R350 Frequency of micturition: Secondary | ICD-10-CM | POA: Diagnosis present

## 2016-12-02 DIAGNOSIS — B9689 Other specified bacterial agents as the cause of diseases classified elsewhere: Secondary | ICD-10-CM

## 2016-12-02 DIAGNOSIS — R0602 Shortness of breath: Secondary | ICD-10-CM | POA: Diagnosis not present

## 2016-12-02 LAB — CBC WITH DIFFERENTIAL/PLATELET
Basophils Absolute: 0 10*3/uL (ref 0.0–0.1)
Basophils Relative: 0 %
Eosinophils Absolute: 0.2 10*3/uL (ref 0.0–0.7)
Eosinophils Relative: 3 %
HCT: 34.5 % — ABNORMAL LOW (ref 36.0–46.0)
Hemoglobin: 11.4 g/dL — ABNORMAL LOW (ref 12.0–15.0)
Lymphocytes Relative: 39 %
Lymphs Abs: 2.9 10*3/uL (ref 0.7–4.0)
MCH: 29.2 pg (ref 26.0–34.0)
MCHC: 33 g/dL (ref 30.0–36.0)
MCV: 88.2 fL (ref 78.0–100.0)
Monocytes Absolute: 0.6 10*3/uL (ref 0.1–1.0)
Monocytes Relative: 8 %
Neutro Abs: 3.7 10*3/uL (ref 1.7–7.7)
Neutrophils Relative %: 50 %
Platelets: 244 10*3/uL (ref 150–400)
RBC: 3.91 MIL/uL (ref 3.87–5.11)
RDW: 14.1 % (ref 11.5–15.5)
WBC: 7.4 10*3/uL (ref 4.0–10.5)

## 2016-12-02 LAB — WET PREP, GENITAL
Sperm: NONE SEEN
Trich, Wet Prep: NONE SEEN
Yeast Wet Prep HPF POC: NONE SEEN

## 2016-12-02 LAB — URINALYSIS, ROUTINE W REFLEX MICROSCOPIC
Bilirubin Urine: NEGATIVE
GLUCOSE, UA: NEGATIVE mg/dL
HGB URINE DIPSTICK: NEGATIVE
KETONES UR: NEGATIVE mg/dL
Nitrite: NEGATIVE
PH: 5.5 (ref 5.0–8.0)
PROTEIN: NEGATIVE mg/dL
Specific Gravity, Urine: 1.019 (ref 1.005–1.030)

## 2016-12-02 LAB — PREGNANCY, URINE: Preg Test, Ur: NEGATIVE

## 2016-12-02 LAB — BASIC METABOLIC PANEL
Anion gap: 11 (ref 5–15)
BUN: 15 mg/dL (ref 6–20)
CO2: 23 mmol/L (ref 22–32)
Calcium: 9 mg/dL (ref 8.9–10.3)
Chloride: 104 mmol/L (ref 101–111)
Creatinine, Ser: 0.69 mg/dL (ref 0.44–1.00)
GFR calc Af Amer: >60 mL/min (ref >60)
GFR calc non Af Amer: >60 mL/min (ref >60)
Glucose, Bld: 114 mg/dL — ABNORMAL HIGH (ref 65–99)
Potassium: 3 mmol/L — ABNORMAL LOW (ref 3.5–5.1)
Sodium: 138 mmol/L (ref 135–145)

## 2016-12-02 LAB — URINALYSIS, MICROSCOPIC (REFLEX)

## 2016-12-02 MED ORDER — AZITHROMYCIN 250 MG PO TABS
1000.0000 mg | ORAL_TABLET | Freq: Once | ORAL | Status: AC
  Administered 2016-12-02: 1000 mg via ORAL
  Filled 2016-12-02: qty 4

## 2016-12-02 MED ORDER — IPRATROPIUM-ALBUTEROL 0.5-2.5 (3) MG/3ML IN SOLN
3.0000 mL | Freq: Once | RESPIRATORY_TRACT | Status: AC
  Administered 2016-12-02: 3 mL via RESPIRATORY_TRACT
  Filled 2016-12-02: qty 3

## 2016-12-02 MED ORDER — METRONIDAZOLE 500 MG PO TABS: 500.0000 mg | ORAL_TABLET | Freq: Two times a day (BID) | ORAL | 0 refills | Status: DC

## 2016-12-02 MED ORDER — POTASSIUM CHLORIDE CRYS ER 20 MEQ PO TBCR
EXTENDED_RELEASE_TABLET | ORAL | Status: AC
  Filled 2016-12-02: qty 2

## 2016-12-02 MED ORDER — CEFTRIAXONE SODIUM 250 MG IJ SOLR
250.0000 mg | Freq: Once | INTRAMUSCULAR | Status: AC
  Administered 2016-12-02: 250 mg via INTRAMUSCULAR
  Filled 2016-12-02: qty 250

## 2016-12-02 MED ORDER — POTASSIUM CHLORIDE CRYS ER 20 MEQ PO TBCR
40.0000 meq | EXTENDED_RELEASE_TABLET | Freq: Once | ORAL | Status: AC
  Administered 2016-12-02: 40 meq via ORAL
  Filled 2016-12-02: qty 2

## 2016-12-02 MED ORDER — FOSFOMYCIN TROMETHAMINE 3 G PO PACK
3.0000 g | PACK | Freq: Once | ORAL | Status: AC
  Administered 2016-12-02: 3 g via ORAL
  Filled 2016-12-02: qty 3

## 2016-12-02 NOTE — Discharge Instructions (Signed)
Please take all of your antibiotics until finished!   You may develop abdominal discomfort or diarrhea from the antibiotic.  You may help offset this with probiotics which you can buy or get in yogurt. Do not eat  or take the probiotics until 2 hours after your antibiotic. Follow-up with your primary care in 2 days as scheduled for reevaluation. Return to the ED if any concerning signs or symptoms develop. You will be called and informed if any of your remaining blood tests are abnormal.

## 2016-12-02 NOTE — ED Notes (Signed)
ED Provider at bedside. 

## 2016-12-02 NOTE — ED Provider Notes (Signed)
[place] DEPT [facility] Provider Note   CSN: [id number] Arrival date & time: 12/02/16  1055     History   Chief Complaint Chief Complaint  Patient presents with  . Abdominal Pain  . Back Pain    HPI ALAIZA Yesenia Simmons is a 46 y.o. female who presents today with chief complaint acute onset, intermittent low back pain with associated urinary frequency, malodorous dark urine, urgency for 3 days and white vaginal discharge since yesterday. She states low back pain is bilateral, does not radiate, and is intermittent in nature. Nothing seems to improve or worsen this pain. She endorses mild lower abdominal achiness that does not radiate. She also endorses mild shortness of breath, consistent with her history of asthma she states. She denies nausea, vomiting, fevers, chills, vaginal pain, vaginal bleeding, vaginal itching, dysuria, hematuria, or melena. She is in a monogamous relationship with one partner. She is a uterine fibroid for which she was scheduled to undergo [facility], but states that she became scared and refused the procedure at the last minute. She states she has not had a menstrual cycle in 2 months. She has had a UTI in the past and states that her symptoms feel similarly. She has not tried anything for her symptoms.  The history is provided by the patient.    Past Medical History:  Diagnosis Date  . Asthma   . Hypertension   . RBBB   . Sinus tachycardia     There are no active problems to display for this patient.   History reviewed. No pertinent surgical history.  OB History    Gravida Para Term Preterm AB Living   5 3 2 1 2      SAB TAB Ectopic Multiple Live Births   2       3       Home Medications    Prior to Admission medications   Medication Sig Start Date End Date Taking? Authorizing Provider  albuterol (PROVENTIL HFA;VENTOLIN HFA) 108 (90 Base) MCG/ACT inhaler Inhale 1-2 puffs into the lungs every 6 (six) hours as needed for wheezing or shortness of breath.     [provider]  albuterol (PROVENTIL) (2.5 MG/3ML) 0.083% nebulizer solution Take 2.5 mg by nebulization every 6 (six) hours as needed for wheezing or shortness of breath.    [provider]  Ascorbic Acid (CHEW-C PO) Take 1 tablet by mouth daily. ENERGY VITAMIN C    [provider]  Cholecalciferol (VITAMIN D3 MAXIMUM STRENGTH PO) Take 1 tablet by mouth daily at 3 pm. (1600)    [provider]  ibuprofen (ADVIL,MOTRIN) 800 MG tablet Take 800 mg by mouth every 8 (eight) hours as needed (for pain.).     [provider]  losartan-hydrochlorothiazide (HYZAAR) 100-25 MG tablet Take 1 tablet by mouth at bedtime.    [provider]  megestrol (MEGACE) 40 MG tablet Take 1 tablet (40 mg total) by mouth 2 (two) times daily. Can increase to two tablets twice a day in the event of heavy bleeding Patient taking differently: Take 80 mg by mouth 2 (two) times daily. In the morning & at 1600 09/20/16   Lavonia Drafts, MD  metroNIDAZOLE (FLAGYL) 500 MG tablet Take 1 tablet (500 mg total) by mouth 2 (two) times daily. 12/02/16   Ronell Duffus A, PA-C  montelukast (SINGULAIR) 10 MG tablet Take 10 mg by mouth daily at 3 pm. (1600)    [provider]  naproxen (NAPROSYN) 375 MG tablet Take 1  tablet (375 mg total) by mouth 2 (two) times daily. Patient not taking: Reported on 11/15/2016 06/03/16   Palumbo, April, MD  oxyCODONE-acetaminophen (PERCOCET) 10-325 MG tablet Take 1 tablet by mouth every 6 (six) hours as needed for pain.    [provider]  traMADol (ULTRAM) 50 MG tablet Take 50 mg by mouth every 6 (six) hours as needed (for pain.).    [provider]    Family History Family History  Problem Relation Age of Onset  . Stroke Maternal Grandmother   . Hypertension Mother   . Hypertension Brother   . Hypertension Brother   . Cancer Neg Hx     Social History Social History  Substance Use Topics  . Smoking status: Never  Smoker  . Smokeless tobacco: Never Used  . Alcohol use No     Allergies   Eggs or egg-derived products; Fish allergy; and Iodine   Review of Systems Review of Systems  Constitutional: Negative for chills and fever.  Respiratory: Positive for shortness of breath.   Cardiovascular: Negative for chest pain.  Gastrointestinal: Positive for abdominal pain (mild pressure). Negative for blood in stool, diarrhea, nausea and vomiting.  Genitourinary: Positive for frequency, urgency and vaginal discharge. Negative for dysuria, hematuria, vaginal bleeding and vaginal pain.  Musculoskeletal: Positive for back pain.  All other systems reviewed and are negative.    Physical Exam Updated Vital Signs BP 119/69   Pulse 73   Temp 98 F (36.7 C) (Oral)   Resp 16   Ht 5\' 1"  (1.549 m)   Wt 82.1 kg (181 lb)   SpO2 100%   BMI 34.20 kg/m   Physical Exam  Constitutional: She is oriented to person, place, and time. She appears well-developed and well-nourished.  HENT:  Head: Normocephalic and atraumatic.  Eyes: Conjunctivae are normal. Right eye exhibits no discharge. Left eye exhibits no discharge.  Neck: No JVD present. No tracheal deviation present.  Cardiovascular: Normal rate, regular rhythm and normal heart sounds.   Pulmonary/Chest: Effort normal. She has wheezes.  Diffuse expiratory wheezes in the posterior lung fields, no increased work of breathing  Abdominal: Soft. Bowel sounds are normal. She exhibits no distension. There is no tenderness. There is no rebound.  Murphy's absent, no tenderness palpation at McBurney's point, Rovsing's absent. No CVA tenderness bilaterally  Genitourinary:  Genitourinary Comments: Examination performed in the presence of a chaperone. No lesions of the external genitalia. Cervical os is multi parous in appearance and closed with no bleeding. Scant white discharge in the vaginal vault. No cervical motion tenderness. Mild suprapubic tenderness on palpation.  No adnexal tenderness. No lesions or masses to vaginal wall.  Musculoskeletal: Normal range of motion. She exhibits tenderness.  Low back tender to palpation along bilateral paraspinal muscle tenderness. No deformity, crepitus, or step-off noted.  Neurological: She is alert and oriented to person, place, and time. No sensory deficit.  Fluent speech, no facial droop normal gait   Skin: Skin is warm and dry. Capillary refill takes less than 2 seconds.  Psychiatric: She has a normal mood and affect. Her behavior is normal.     ED Treatments / Results  Labs (all labs ordered are listed, but only abnormal results are displayed) Labs Reviewed  WET PREP, GENITAL - Abnormal; Notable for the following:       Result Value   Clue Cells Wet Prep HPF POC PRESENT (*)    WBC, Wet Prep HPF POC MODERATE (*)    All other  components within normal limits  URINALYSIS, ROUTINE W REFLEX MICROSCOPIC - Abnormal; Notable for the following:    Leukocytes, UA MODERATE (*)    All other components within normal limits  BASIC METABOLIC PANEL - Abnormal; Notable for the following:    Potassium 3.0 (*)    Glucose, Bld 114 (*)    All other components within normal limits  CBC WITH DIFFERENTIAL/PLATELET - Abnormal; Notable for the following:    Hemoglobin 11.4 (*)    HCT 34.5 (*)    All other components within normal limits  URINALYSIS, MICROSCOPIC (REFLEX) - Abnormal; Notable for the following:    Bacteria, UA RARE (*)    Squamous Epithelial / LPF 6-30 (*)    All other components within normal limits  URINE CULTURE  PREGNANCY, URINE  RPR  HIV ANTIBODY (ROUTINE TESTING)  GC/CHLAMYDIA PROBE AMP ([place]) NOT AT [facility]    EKG  EKG Interpretation None       Radiology No results found.  Procedures Procedures (including critical care time)  Medications Ordered in ED Medications  fosfomycin (MONUROL) packet 3 g (not administered)  cefTRIAXone (ROCEPHIN) injection 250 mg (not administered)    azithromycin (ZITHROMAX) tablet 1,000 mg (not administered)  ipratropium-albuterol (DUONEB) 0.5-2.5 (3) MG/3ML nebulizer solution 3 mL (3 mLs Nebulization Given 12/02/16 1125)  potassium chloride SA (K-DUR,KLOR-CON) CR tablet 40 mEq (40 mEq Oral Given 12/02/16 1228)     Initial Impression / Assessment and Plan / ED Course  I have reviewed the triage vital signs and the nursing notes.  Pertinent labs & imaging results that were available during my care of the patient were reviewed by me and considered in my medical decision making (see chart for details).     Patient with urinary frequency, vaginal discharge, and intermittent low back pain. Afebrile, vital signs are stable. UA with pyuria, sent for culture. Wet prep consistent with BV. She is requesting prophylaxis for STDs at this time, which I feel is reasonable. Low suspicion of TOA, ovarian torsion, PID, appendicitis, or other intra-abdominal pathology. Will treat with fosfomycin here for uncomplicated UTI and sent home with Flagyl for treatment of BV. Informed patient that she will be informed if any of her remaining lab work is abnormal. She'll follow-up with her primary care in 2 days as scheduled for reevaluation. Discussed indications for return to the ED. Pt verbalized understanding of and agreement with plan and is safe for discharge home at this time.   Final Clinical Impressions(s) / ED Diagnoses   Final diagnoses:  BV (bacterial vaginosis)  Urinary frequency  Acute bilateral low back pain without sciatica    New Prescriptions New Prescriptions   METRONIDAZOLE (FLAGYL) 500 MG TABLET    Take 1 tablet (500 mg total) by mouth 2 (two) times daily.     Renita Papa, PA-C 12/02/16 1307    Gareth Morgan, MD 12/06/16 1359

## 2016-12-02 NOTE — ED Triage Notes (Signed)
Lower back and abdominal pain x 2 days. White vaginal discharge and dark urine.

## 2016-12-03 LAB — URINE CULTURE

## 2016-12-03 LAB — RPR, QUANT+TP ABS (REFLEX)
Rapid Plasma Reagin, Quant: 1:2 {titer} — ABNORMAL HIGH
T Pallidum Abs: NEGATIVE

## 2016-12-03 LAB — RPR: RPR Ser Ql: REACTIVE — AB

## 2016-12-03 LAB — HIV ANTIBODY (ROUTINE TESTING W REFLEX): HIV Screen 4th Generation wRfx: NONREACTIVE

## 2016-12-03 LAB — GC/CHLAMYDIA PROBE AMP (~~LOC~~) NOT AT ARMC
Chlamydia: NEGATIVE
Neisseria Gonorrhea: NEGATIVE

## 2016-12-04 ENCOUNTER — Ambulatory Visit: Payer: Medicaid Other | Admitting: Family Medicine

## 2017-01-22 ENCOUNTER — Encounter (HOSPITAL_COMMUNITY): Payer: Self-pay

## 2017-01-22 ENCOUNTER — Emergency Department (HOSPITAL_COMMUNITY)
Admission: EM | Admit: 2017-01-22 | Discharge: 2017-01-22 | Disposition: A | Discharge status: Home / Self Care | Payer: Medicaid Other | Attending: Emergency Medicine | Admitting: Emergency Medicine

## 2017-01-22 DIAGNOSIS — Z79899 Other long term (current) drug therapy: Secondary | ICD-10-CM | POA: Insufficient documentation

## 2017-01-22 DIAGNOSIS — M25531 Pain in right wrist: Secondary | ICD-10-CM | POA: Insufficient documentation

## 2017-01-22 DIAGNOSIS — M791 Myalgia, unspecified site: Secondary | ICD-10-CM

## 2017-01-22 DIAGNOSIS — Z79891 Long term (current) use of opiate analgesic: Secondary | ICD-10-CM | POA: Insufficient documentation

## 2017-01-22 DIAGNOSIS — F1190 Opioid use, unspecified, uncomplicated: Secondary | ICD-10-CM

## 2017-01-22 LAB — CBG MONITORING, ED: Glucose-Capillary: 79 mg/dL (ref 65–99)

## 2017-01-22 MED ORDER — KETOROLAC TROMETHAMINE 30 MG/ML IJ SOLN
30.0000 mg | Freq: Once | INTRAMUSCULAR | Status: AC
  Administered 2017-01-22: 30 mg via INTRAVENOUS
  Filled 2017-01-22: qty 1

## 2017-01-22 MED ORDER — MELOXICAM 7.5 MG PO TABS: 7.5000 mg | ORAL_TABLET | Freq: Every day | ORAL | 0 refills | Status: DC

## 2017-01-22 NOTE — ED Notes (Signed)
Pt is in stable condition upon d/c and ambulates from ED. Pt refused to leave until she spoke with [place], PA about why he won't prescribe her narcotics.

## 2017-01-22 NOTE — ED Provider Notes (Signed)
[place] DEPT Provider Note   CSN: [id number] Arrival date & time: 01/22/17  1216  By signing my name below, I, Ephriam Jenkins, attest that this documentation has been prepared under the direction and in the presence of Carlisle Cater PA-C  Electronically Signed: Ephriam Jenkins, ED Scribe. 01/22/17. 2:36 PM.  History   Chief Complaint Chief Complaint  Patient presents with  . body aches    HPI HPI Comments: Yesenia Simmons is a 46 y.o. female who presents to the Emergency Department complaining of acute on chronic right wrist pain and swelling that started last week. Pt states that she frequently uses her hands at her job and notes an increased pain and swelling in her right wrist within the past week. She denies any known injury. She further reports dull lower back pain which is non-radiating. Pt notes that she was dx with carpal tunnel syndrome by her PCP a while ago and states that this feels similar to previous flares of her carpal tunnel, no splints used for them. Pt recently moved here from out out town and is in the process of trying to find a PCP. Prior to moving here pt was prescribed Percocet 10/325mg  which she cannot refill her prescription for due to lack of PCP here. She saw a provider in attempt to establish a primary care here and states that they would only prescribe her 10 Percocet's for the time being. She is currently out of this prescription and notes that she is still in the process of establishing primary care. No fever, cough, sore throat, nausea, vomiting.   The history is provided by the patient. No language interpreter was used.    Past Medical History:  Diagnosis Date  . Asthma   . Hypertension   . RBBB   . Sinus tachycardia     There are no active problems to display for this patient.   History reviewed. No pertinent surgical history.  OB History    Gravida Para Term Preterm AB Living   5 3 2 1 2      SAB TAB Ectopic Multiple Live Births   2       3        Home Medications    Prior to Admission medications   Medication Sig Start Date End Date Taking? Authorizing Provider  albuterol (PROVENTIL HFA;VENTOLIN HFA) 108 (90 Base) MCG/ACT inhaler Inhale 1-2 puffs into the lungs every 6 (six) hours as needed for wheezing or shortness of breath.    [provider]  albuterol (PROVENTIL) (2.5 MG/3ML) 0.083% nebulizer solution Take 2.5 mg by nebulization every 6 (six) hours as needed for wheezing or shortness of breath.    [provider]  Ascorbic Acid (CHEW-C PO) Take 1 tablet by mouth daily. ENERGY VITAMIN C    [provider]  Cholecalciferol (VITAMIN D3 MAXIMUM STRENGTH PO) Take 1 tablet by mouth daily at 3 pm. (1600)    [provider]  ibuprofen (ADVIL,MOTRIN) 800 MG tablet Take 800 mg by mouth every 8 (eight) hours as needed (for pain.).     [provider]  losartan-hydrochlorothiazide (HYZAAR) 100-25 MG tablet Take 1 tablet by mouth at bedtime.    [provider]  megestrol (MEGACE) 40 MG tablet Take 1 tablet (40 mg total) by mouth 2 (two) times daily. Can increase to two tablets twice a day in the event of heavy bleeding Patient taking differently: Take 80 mg by mouth 2 (two) times daily. In the morning & at  1600 09/20/16   Lavonia Drafts, MD  metroNIDAZOLE (FLAGYL) 500 MG tablet Take 1 tablet (500 mg total) by mouth 2 (two) times daily. 12/02/16   Fawze, Mina A, PA-C  montelukast (SINGULAIR) 10 MG tablet Take 10 mg by mouth daily at 3 pm. (1600)    [provider]  naproxen (NAPROSYN) 375 MG tablet Take 1 tablet (375 mg total) by mouth 2 (two) times daily. Patient not taking: Reported on 11/15/2016 06/03/16   Palumbo, April, MD  oxyCODONE-acetaminophen (PERCOCET) 10-325 MG tablet Take 1 tablet by mouth every 6 (six) hours as needed for pain.    [provider]  traMADol (ULTRAM) 50 MG tablet Take 50 mg by mouth every 6 (six) hours as needed (for pain.).     [provider]    Family History Family History  Problem Relation Age of Onset  . Stroke Maternal Grandmother   . Hypertension Mother   . Hypertension Brother   . Hypertension Brother   . Cancer Neg Hx     Social History Social History  Substance Use Topics  . Smoking status: Never Smoker  . Smokeless tobacco: Never Used  . Alcohol use No    Allergies   Eggs or egg-derived products; Fish allergy; and Iodine   Review of Systems Review of Systems  Constitutional: Negative for fever.  HENT: Negative for rhinorrhea and sore throat.   Eyes: Negative for redness.  Respiratory: Positive for cough (occasional).   Cardiovascular: Negative for chest pain.  Gastrointestinal: Negative for abdominal pain, blood in stool, diarrhea, nausea and vomiting.  Genitourinary: Negative for dysuria and hematuria.  Musculoskeletal: Positive for arthralgias, back pain, joint swelling and myalgias.  Skin: Negative for rash and wound.  Neurological: Negative for headaches.     Physical Exam Updated Vital Signs BP (!) 149/90   Pulse 99   Temp 97.9 F (36.6 C)   Resp 18   Wt 180 lb (81.6 kg)   SpO2 100%   BMI 34.01 kg/m   Physical Exam  Constitutional: She is oriented to person, place, and time. She appears well-developed and well-nourished. No distress.  HENT:  Head: Normocephalic and atraumatic.  Eyes: Conjunctivae are normal. Right eye exhibits no discharge. Left eye exhibits no discharge.  Neck: Normal range of motion. Neck supple.  Cardiovascular: Normal rate, regular rhythm and normal heart sounds.   Pulmonary/Chest: Effort normal and breath sounds normal.  Abdominal: Soft. There is no tenderness.  Musculoskeletal:       Right shoulder: She exhibits tenderness. She exhibits normal range of motion and no bony tenderness.       Left shoulder: She exhibits tenderness. She exhibits normal range of motion and no bony tenderness.       Right elbow: Normal.      Left  elbow: Normal.       Right wrist: She exhibits tenderness. She exhibits normal range of motion and no bony tenderness.       Left wrist: Normal.       Right hip: Normal.       Left hip: Normal.       Cervical back: She exhibits tenderness. She exhibits normal range of motion and no bony tenderness.       Thoracic back: She exhibits tenderness. She exhibits normal range of motion and no bony tenderness.       Lumbar back: She exhibits tenderness. She exhibits normal range of motion, no bony tenderness and no swelling.  Neurological: She is alert  and oriented to person, place, and time.  Skin: Skin is warm and dry. She is not diaphoretic.  Psychiatric: She has a normal mood and affect. Judgment normal.  Nursing note and vitals reviewed.    ED Treatments / Results  DIAGNOSTIC STUDIES: Oxygen Saturation is 100% on RA, normal by my interpretation.  COORDINATION OF CARE: 2:11 PM-Will order Toradol injection. Discussed treatment plan with pt at bedside and pt agreed to plan. Will check CBG, pt reports questionable history of diabetes and he reports recent fatigue.  Labs (all labs ordered are listed, but only abnormal results are displayed) Labs Reviewed  CBG MONITORING, ED   2:47 PM Prior to discharge, called back to patient's room to discuss why I cannot rx opioid pain medications. Encouraged patient to use treatments as prescribed today. Patient given referrals for pain management and PCP.   Procedures Procedures (including critical care time)   Initial Impression / Assessment and Plan / ED Course  I have reviewed the triage vital signs and the nursing notes.  Pertinent labs & imaging results that were available during my care of the patient were reviewed by me and considered in my medical decision making (see chart for details).     Vital signs reviewed and are as follows: Vitals:   01/22/17 1316  BP: (!) 149/90  Pulse: 99  Resp: 18  Temp: 97.9 F (36.6 C)     Final  Clinical Impressions(s) / ED Diagnoses   Final diagnoses:  Right wrist pain  Myalgia  Chronic, continuous use of opioids   Patient with multiple chronic pain concerns, no new acute injuries. No imaging indicated. Review of the [place] controlled substance reporting system shows that she was receiving monthly prescriptions for pain medications (oxycodone and tramadol) through 01/16/2017. Discussed that we cannot manage her chronic symptoms here. Patient encouraged to use her home with muscle relaxer and she is prescribed anti-inflammatories today for her chronic issues. Encouraged follow-up.  New Prescriptions New Prescriptions   MELOXICAM (MOBIC) 7.5 MG TABLET    Take 1 tablet (7.5 mg total) by mouth daily.   I personally performed the services described in this documentation, which was scribed in my presence. The recorded information has been reviewed and is accurate.     Carlisle Cater, PA-C 01/22/17 1448    Daleen Bo, MD 01/23/17 [phone]

## 2017-01-22 NOTE — Discharge Instructions (Signed)
Please read and follow all provided instructions.  Your diagnoses today include:  1. Right wrist pain   2. Myalgia   3. Chronic, continuous use of opioids     Tests performed today include:  Blood sugar - normal  Vital signs. See below for your results today.   Medications prescribed:   Meloxicam - anti-inflammatory pain medication  You have been prescribed an anti-inflammatory medication or NSAID. Take with food. Do not take aspirin, ibuprofen, or naproxen if taking this medication. Take smallest effective dose for the shortest duration needed for your pain. Stop taking if you experience stomach pain or vomiting.   Take any prescribed medications only as directed.  Home care instructions:  Follow any educational materials contained in this packet.  BE VERY CAREFUL not to take multiple medicines containing Tylenol (also called acetaminophen). Doing so can lead to an overdose which can damage your liver and cause liver failure and possibly death.   Follow-up instructions: Please follow-up with your primary care provider in the next 7 days for further evaluation of your symptoms.   Return instructions:   Please return to the Emergency Department if you experience worsening symptoms.   Please return if you have any other emergent concerns.  Additional Information:  Your vital signs today were: BP (!) 149/90    Pulse 99    Temp 97.9 F (36.6 C)    Resp 18    Wt 81.6 kg (180 lb)    SpO2 100%    BMI 34.01 kg/m  If your blood pressure (BP) was elevated above 135/85 this visit, please have this repeated by your doctor within one month. --------------

## 2017-01-22 NOTE — ED Triage Notes (Signed)
Pt presents to the ed with complaints of aching all over her body for the last week since she started back at her job, states she cannot work there it makes her body hurt all over. Pt reports her right hand getting cramped up this morning. Alert, oriented and ambulatory with a steady gait. Reports that she is out of her pain medication for arthritis.

## 2017-01-27 ENCOUNTER — Emergency Department (HOSPITAL_COMMUNITY)
Admission: EM | Admit: 2017-01-27 | Discharge: 2017-01-27 | Disposition: A | Discharge status: Home / Self Care | Payer: Medicaid Other | Attending: Emergency Medicine | Admitting: Emergency Medicine

## 2017-01-27 ENCOUNTER — Ambulatory Visit: Payer: Medicaid Other | Admitting: Obstetrics & Gynecology

## 2017-01-27 ENCOUNTER — Encounter (HOSPITAL_COMMUNITY): Payer: Self-pay | Admitting: Emergency Medicine

## 2017-01-27 DIAGNOSIS — M25532 Pain in left wrist: Secondary | ICD-10-CM | POA: Insufficient documentation

## 2017-01-27 DIAGNOSIS — I10 Essential (primary) hypertension: Secondary | ICD-10-CM | POA: Insufficient documentation

## 2017-01-27 DIAGNOSIS — Z79899 Other long term (current) drug therapy: Secondary | ICD-10-CM | POA: Insufficient documentation

## 2017-01-27 DIAGNOSIS — J45909 Unspecified asthma, uncomplicated: Secondary | ICD-10-CM | POA: Insufficient documentation

## 2017-01-27 DIAGNOSIS — M25531 Pain in right wrist: Secondary | ICD-10-CM | POA: Insufficient documentation

## 2017-01-27 MED ORDER — OXYCODONE-ACETAMINOPHEN 5-325 MG PO TABS
1.0000 | ORAL_TABLET | Freq: Once | ORAL | Status: AC
  Administered 2017-01-27: 1 via ORAL
  Filled 2017-01-27: qty 1

## 2017-01-27 NOTE — ED Triage Notes (Signed)
Pt to ER for evaluation of pain related to carpal tunnel per patient in bilateral wrists/hands. States is worse due to rain. A/o x4. NAD

## 2017-01-27 NOTE — ED Notes (Signed)
Pt ambulated to lobby with family member after being given work note.

## 2017-01-27 NOTE — Progress Notes (Signed)
Orthopedic Tech Progress Note Patient Details:  Yesenia Simmons Feb 13, 1971 [id number]  Ortho Devices Type of Ortho Device: Velcro wrist splint Ortho Device/Splint Location: (B) UE Ortho Device/Splint Interventions: Ordered, Application   Braulio Bosch 01/27/2017, 6:25 PM

## 2017-01-27 NOTE — ED Provider Notes (Signed)
[facility] DEPT Provider Note   CSN: [id number] Arrival date & time: 01/27/17  1642   By signing my name below, I, Eunice Blase, attest that this documentation has been prepared under the direction and in the presence of [facility], [place]. Electronically signed, Eunice Blase, ED Scribe. 01/27/17. 6:03 PM.   History   Chief Complaint Chief Complaint  Patient presents with  . Carpal Tunnel   The history is provided by the patient and medical records. No language interpreter was used.   Yesenia Simmons is a 46 y.o. female with h/o carpal tunnel presenting to the Emergency Department concerning bilateral hand pains x ~1 week. Associated  warmth and swelling. She states she started a new job ~2 weeks ago and she has been unable to work since the onset of her presenting pain. She described 10/10, constant throbbing in the R > L hands that is worse when moving them. She states she was followed by a Dr. in [place] for this problem in the past, but she recently moved to [place] and has been unable to obtain a prescription for percocet, which her Dr. provided when he followed her. Pt also states she was given wrist braces a long time ago that she no longer has. She notes she was prescribed antiinflammatory medications recently that have provided mild relief. Pt R handed. No other complaints at this time.   Past Medical History:  Diagnosis Date  . Asthma   . Hypertension   . RBBB   . Sinus tachycardia     There are no active problems to display for this patient.   History reviewed. No pertinent surgical history.  OB History    Gravida Para Term Preterm AB Living   5 3 2 1 2      SAB TAB Ectopic Multiple Live Births   2       3       Home Medications    Prior to Admission medications   Medication Sig Start Date End Date Taking? Authorizing Provider  albuterol (PROVENTIL HFA;VENTOLIN HFA) 108 (90 Base) MCG/ACT inhaler Inhale 1-2 puffs into the lungs every 6 (six)  hours as needed for wheezing or shortness of breath.    [provider]  albuterol (PROVENTIL) (2.5 MG/3ML) 0.083% nebulizer solution Take 2.5 mg by nebulization every 6 (six) hours as needed for wheezing or shortness of breath.    [provider]  Ascorbic Acid (CHEW-C PO) Take 1 tablet by mouth daily. ENERGY VITAMIN C    [provider]  Cholecalciferol (VITAMIN D3 MAXIMUM STRENGTH PO) Take 1 tablet by mouth daily at 3 pm. (1600)    [provider]  ibuprofen (ADVIL,MOTRIN) 800 MG tablet Take 800 mg by mouth every 8 (eight) hours as needed (for pain.).     [provider]  losartan-hydrochlorothiazide (HYZAAR) 100-25 MG tablet Take 1 tablet by mouth at bedtime.    [provider]  megestrol (MEGACE) 40 MG tablet Take 1 tablet (40 mg total) by mouth 2 (two) times daily. Can increase to two tablets twice a day in the event of heavy bleeding Patient taking differently: Take 80 mg by mouth 2 (two) times daily. In the morning & at 1600 09/20/16   Lavonia Drafts, MD  meloxicam (MOBIC) 7.5 MG tablet Take 1 tablet (7.5 mg total) by mouth daily. 01/22/17   Carlisle Cater, PA-C  metroNIDAZOLE (FLAGYL) 500 MG tablet Take 1 tablet (500 mg total) by mouth 2 (two) times daily. 12/02/16  Fawze, Mina A, PA-C  montelukast (SINGULAIR) 10 MG tablet Take 10 mg by mouth daily at 3 pm. (1600)    [provider]  oxyCODONE-acetaminophen (PERCOCET) 10-325 MG tablet Take 1 tablet by mouth every 6 (six) hours as needed for pain.    [provider]  traMADol (ULTRAM) 50 MG tablet Take 50 mg by mouth every 6 (six) hours as needed (for pain.).    [provider]    Family History Family History  Problem Relation Age of Onset  . Stroke Maternal Grandmother   . Hypertension Mother   . Hypertension Brother   . Hypertension Brother   . Cancer Neg Hx     Social History Social History  Substance Use Topics  . Smoking status: Never  Smoker  . Smokeless tobacco: Never Used  . Alcohol use No     Allergies   Eggs or egg-derived products; Fish allergy; and Iodine   Review of Systems Review of Systems  Constitutional: Negative for fever.  HENT: Negative.   Gastrointestinal: Negative for nausea and vomiting.  Musculoskeletal: Positive for arthralgias, joint swelling and myalgias.  Skin: Negative for color change and wound.  Neurological: Negative for weakness and numbness.     Physical Exam Updated Vital Signs BP 128/81 (BP Location: Right Arm)   Pulse 86   Temp 98.3 F (36.8 C) (Oral)   Resp 18   SpO2 98%   Physical Exam  Constitutional: She appears well-developed and well-nourished. No distress.  HENT:  Head: Normocephalic.  Eyes: EOM are normal.  Neck: Normal range of motion. Neck supple.  Cardiovascular: Normal rate, regular rhythm and intact distal pulses.   Pulmonary/Chest: Effort normal.  Musculoskeletal: Normal range of motion. She exhibits tenderness.       Right hand: She exhibits tenderness. She exhibits normal capillary refill, no deformity and no laceration. Swelling: minimal. Normal sensation noted. She exhibits no thumb/finger opposition.  2+ radial pulses bilaterally. Slightly decreased grip strength in the R hand d/t pain. Swelling noted to dorsal, radial aspect of R wrist. Full passive ROM of wrists bilaterally. Positive Tinel's sign. Negative finger to thumb opposition.  Neurological: She is alert.  Skin: Skin is warm and dry.  Psychiatric: She has a normal mood and affect. Her behavior is normal.  Nursing note and vitals reviewed.    ED Treatments / Results  DIAGNOSTIC STUDIES: Oxygen Saturation is 98% on RA, NL by my interpretation.    COORDINATION OF CARE: 5:34 PM-Discussed next steps with pt. Pt verbalized understanding and is agreeable with the plan. Will order medications and provide referral. Pt prepared for d/c, advised of symptomatic care at home, F/U instructions and  return precautions.    Labs (all labs ordered are listed, but only abnormal results are displayed) Labs Reviewed - No data to display   Radiology No results found.  Procedures Procedures (including critical care time)  Medications Ordered in ED Medications  oxyCODONE-acetaminophen (PERCOCET/ROXICET) 5-325 MG per tablet 1 tablet (1 tablet Oral Given 01/27/17 1805)     Initial Impression / Assessment and Plan / ED Course  I have reviewed the triage vital signs and the nursing notes.  No indication for X-Ray at this time since it has been an ongoing problem.  Pt advised to follow up with hand surgeon. Patient given bilateral wrist splint while in ED, conservative therapy recommended and discussed. Patient will be discharged home & is agreeable with above plan. Returns precautions discussed. Pt appears safe for discharge.  Final Clinical Impressions(s) / ED Diagnoses   Final diagnoses:  Bilateral wrist pain    New Prescriptions Discharge Medication List as of 01/27/2017  5:44 PM    I personally performed the services described in this documentation, which was scribed in my presence. The recorded information has been reviewed and is accurate.     Debroah Baller [place], NP 01/27/17 [place], [place], DO 01/29/17 [phone]

## 2017-01-27 NOTE — Discharge Instructions (Signed)
Call Dr. Angus Palms office to schedule an appointment.

## 2017-02-10 ENCOUNTER — Ambulatory Visit (INDEPENDENT_AMBULATORY_CARE_PROVIDER_SITE_OTHER): Payer: Medicaid Other | Admitting: Physician Assistant

## 2017-02-10 ENCOUNTER — Encounter (INDEPENDENT_AMBULATORY_CARE_PROVIDER_SITE_OTHER): Payer: Self-pay | Admitting: Physician Assistant

## 2017-02-10 VITALS — BP 131/78 | HR 74 | Temp 97.9°F | Wt 184.2 lb

## 2017-02-10 DIAGNOSIS — R739 Hyperglycemia, unspecified: Secondary | ICD-10-CM

## 2017-02-10 DIAGNOSIS — I10 Essential (primary) hypertension: Secondary | ICD-10-CM

## 2017-02-10 DIAGNOSIS — G5603 Carpal tunnel syndrome, bilateral upper limbs: Secondary | ICD-10-CM

## 2017-02-10 DIAGNOSIS — J45909 Unspecified asthma, uncomplicated: Secondary | ICD-10-CM

## 2017-02-10 DIAGNOSIS — M654 Radial styloid tenosynovitis [de Quervain]: Secondary | ICD-10-CM

## 2017-02-10 DIAGNOSIS — J45998 Other asthma: Secondary | ICD-10-CM

## 2017-02-10 LAB — POCT GLYCOSYLATED HEMOGLOBIN (HGB A1C): HEMOGLOBIN A1C: 5.4

## 2017-02-10 MED ORDER — PREDNISONE 10 MG (48) PO TBPK: ORAL_TABLET | ORAL | 0 refills | Status: DC

## 2017-02-10 MED ORDER — LOSARTAN POTASSIUM-HCTZ 100-25 MG PO TABS: 1.0000 | ORAL_TABLET | ORAL | 3 refills | Status: DC

## 2017-02-10 MED ORDER — ALBUTEROL SULFATE HFA 108 (90 BASE) MCG/ACT IN AERS: 1.0000 | INHALATION_SPRAY | Freq: Four times a day (QID) | RESPIRATORY_TRACT | 5 refills | Status: DC | PRN

## 2017-02-10 MED ORDER — ASPIRIN EC 81 MG PO TBEC: 81.0000 mg | DELAYED_RELEASE_TABLET | Freq: Every day | ORAL | 3 refills | Status: DC

## 2017-02-10 MED ORDER — AMLODIPINE BESYLATE 10 MG PO TABS: 10.0000 mg | ORAL_TABLET | Freq: Every day | ORAL | 1 refills | Status: DC

## 2017-02-10 NOTE — Progress Notes (Signed)
Pt has a Hx of asthma, carpal tunnel/arthritis, HTN and fibriods

## 2017-02-10 NOTE — Patient Instructions (Addendum)
De Quervain Tenosynovitis Tendons attach muscles to bones. They also help with joint movements. When tendons become irritated or swollen, it is called tendinitis. The extensor pollicis brevis (EPB) tendon connects the EPB muscle to a bone that is near the base of the thumb. The EPB muscle helps to straighten and extend the thumb. De Quervain tenosynovitis is a condition in which the EPB tendon lining (sheath) becomes irritated, thickened, and swollen. This condition is sometimes called stenosing tenosynovitis. This condition causes pain on the thumb side of the back of the wrist. What are the causes? Causes of this condition include:  Activities that repeatedly cause your thumb and wrist to extend.  A sudden increase in activity or change in activity that affects your wrist.  What increases the risk? This condition is more likely to develop in:  Females.  People who have diabetes.  Women who have recently given birth.  People who are over 98 years of age.  People who do activities that involve repeated hand and wrist motions, such as tennis, racquetball, volleyball, gardening, and taking care of children.  People who do heavy labor.  People who have poor wrist strength and flexibility.  People who do not warm up properly before activities.  What are the signs or symptoms? Symptoms of this condition include:  Pain or tenderness over the thumb side of the back of the wrist when your thumb and wrist are not moving.  Pain that gets worse when you straighten your thumb or extend your thumb or wrist.  Pain when the injured area is touched.  Locking or catching of the thumb joint while you bend and straighten your thumb.  Decreased thumb motion due to pain.  Swelling over the affected area.  How is this diagnosed? This condition is diagnosed with a medical history and physical exam. Your health care provider will ask for details about your injury and ask about your  symptoms. How is this treated? Treatment may include the use of icing and medicines to reduce pain and swelling. You may also be advised to wear a splint or brace to limit your thumb and wrist motion. In less severe cases, treatment may also include working with a physical therapist to strengthen your wrist and calm the irritation around your EPB tendon sheath. In severe cases, surgery may be needed. Follow these instructions at home: If you have a splint or brace:  Wear it as told by your health care provider. Remove it only as told by your health care provider.  Loosen the splint or brace if your fingers become numb and tingle, or if they turn cold and blue.  Keep the splint or brace clean and dry. Managing pain, stiffness, and swelling  If directed, apply ice to the injured area. ? Put ice in a plastic bag. ? Place a towel between your skin and the bag. ? Leave the ice on for 20 minutes, 2-3 times per day.  Move your fingers often to avoid stiffness and to lessen swelling.  Raise (elevate) the injured area above the level of your heart while you are sitting or lying down. General instructions  Return to your normal activities as told by your health care provider. Ask your health care provider what activities are safe for you.  Take over-the-counter and prescription medicines only as told by your health care provider.  Keep all follow-up visits as told by your health care provider. This is important.  Do not drive or operate heavy machinery while taking  prescription pain medicine. Contact a health care provider if:  Your pain, tenderness, or swelling gets worse, even if you have had treatment.  You have numbness or tingling in your wrist, hand, or fingers on the injured side. This information is not intended to replace advice given to you by your health care provider. Make sure you discuss any questions you have with your health care provider. Document Released: 06/17/2005  Document Revised: 11/23/2015 Document Reviewed: 08/23/2014 Elsevier Interactive Patient Education  2018 [place] Release Carpal tunnel release is a surgical procedure to relieve numbness and pain in your hand that are caused by carpal tunnel syndrome. Your carpal tunnel is a narrow, hollow space in your wrist. It passes between your wrist bones and a band of connective tissue (transverse carpal ligament). The nerve that supplies most of your hand (median nerve) passes through this space, and so do the connections between your fingers and the muscles of your arm (tendons). Carpal tunnel syndrome makes this space swell and become narrow, and this causes pain and numbness. In carpal tunnel release surgery, a surgeon cuts through the transverse carpal ligament to make more room in the carpal tunnel space. You may have this surgery if other types of treatment have not worked. Tell a health care provider about:  Any allergies you have.  All medicines you are taking, including vitamins, herbs, eye drops, creams, and over-the-counter medicines.  Any problems you or family members have had with anesthetic medicines.  Any blood disorders you have.  Any surgeries you have had.  Any medical conditions you have. What are the risks? Generally, this is a safe procedure. However, problems may occur, including:  Bleeding.  Infection.  Injury to the median nerve.  Need for additional surgery.  What happens before the procedure?  Ask your health care provider about: ? Changing or stopping your regular medicines. This is especially important if you are taking diabetes medicines or blood thinners. ? Taking medicines such as aspirin and ibuprofen. These medicines can thin your blood. Do not take these medicines before your procedure if your health care provider instructs you not to.  Do not eat or drink anything after midnight on the night before the procedure or as directed by your  health care provider.  Plan to have someone take you home after the procedure. What happens during the procedure?  An IV tube may be inserted into a vein.  You will be given one of the following: ? A medicine that numbs the wrist area (local anesthetic). You may also be given a medicine to make you relax (sedative). ? A medicine that makes you go to sleep (general anesthetic).  Your arm, hand, and wrist will be cleaned with a germ-killing solution (antiseptic).  Your surgeon will make a surgical cut (incision) over the palm side of your wrist. The surgeon will pull aside the skin of your wrist to expose the carpal tunnel space.  The surgeon will cut the transverse carpal ligament.  The edges of the incision will be closed with stitches (sutures) or staples.  A bandage (dressing) will be placed over your wrist and wrapped around your hand and wrist. What happens after the procedure?  You may spend some time in a recovery area.  Your blood pressure, heart rate, breathing rate, and blood oxygen level will be monitored often until the medicines you were given have worn off.  You will likely have some pain. You will be given pain medicine.  You  may need to wear a splint or a wrist brace over your dressing. This information is not intended to replace advice given to you by your health care provider. Make sure you discuss any questions you have with your health care provider. Document Released: 09/07/2003 Document Revised: 11/23/2015 Document Reviewed: 02/02/2014 Elsevier Interactive Patient Education  2017 [employer].

## 2017-02-10 NOTE — Progress Notes (Signed)
Subjective:  Patient ID: Yesenia Simmons, female    DOB: [birth year]  Age: 46 y.o. MRN: [id number]  CC: HTN, medication refills  HPI Yesenia Simmons is a 46 y.o. female with a PMH of asthma, CTS, HTN, RBBB, and sinus tachycardia presents to establish care for HTN. Currently taking Hyzaar 100-25 mg. BP today is 131/78 in clinic but pt states she has noticed her BP in usually in the 140s.     Patient's main concern is pain and tingling in the hands and wrists. 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 Has not had steroid injections into the wrists. Says pain is worse with gripping and repetitive motion of the hands/wrists. Pain usually felt on the 1st, 2nd, 3rd, and 4th digits of the hands. She also has pain along the right thumb's extensor pollicis brevis tendon. Used to wear a wrist splint on both wrists but has not done so recently.       Lastly, requests refill on Albuterol inhaler. Has asthma exacerbation "once or twice per week". Happens when she increases her activities such as when going up or down the stairs repeatedly. Has mild wheezing currently but no SOB.    Outpatient Medications Prior to Visit  Medication Sig Dispense Refill  . albuterol (PROVENTIL HFA;VENTOLIN HFA) 108 (90 Base) MCG/ACT inhaler Inhale 1-2 puffs into the lungs every 6 (six) hours as needed for wheezing or shortness of breath.    Marland Kitchen albuterol (PROVENTIL) (2.5 MG/3ML) 0.083% nebulizer solution Take 2.5 mg by nebulization every 6 (six) hours as needed for wheezing or shortness of breath.    . Ascorbic Acid (CHEW-C PO) Take 1 tablet by mouth daily. ENERGY VITAMIN C    . Cholecalciferol (VITAMIN D3 MAXIMUM STRENGTH PO) Take 1 tablet by mouth daily at 3 pm. (1600)    . ibuprofen (ADVIL,MOTRIN) 800 MG tablet Take 800 mg by mouth every 8 (eight) hours as needed (for pain.).     Marland Kitchen losartan-hydrochlorothiazide (HYZAAR) 100-25 MG tablet Take 1 tablet by mouth at bedtime.    .  megestrol (MEGACE) 40 MG tablet Take 1 tablet (40 mg total) by mouth 2 (two) times daily. Can increase to two tablets twice a day in the event of heavy bleeding (Patient taking differently: Take 80 mg by mouth 2 (two) times daily. In the morning & at 1600) 60 tablet 2  . meloxicam (MOBIC) 7.5 MG tablet Take 1 tablet (7.5 mg total) by mouth daily. 15 tablet 0  . montelukast (SINGULAIR) 10 MG tablet Take 10 mg by mouth daily at 3 pm. (1600)    . oxyCODONE-acetaminophen (PERCOCET) 10-325 MG tablet Take 1 tablet by mouth every 6 (six) hours as needed for pain.    . traMADol (ULTRAM) 50 MG tablet Take 50 mg by mouth every 6 (six) hours as needed (for pain.).    Marland Kitchen metroNIDAZOLE (FLAGYL) 500 MG tablet Take 1 tablet (500 mg total) by mouth 2 (two) times daily. 14 tablet 0   No facility-administered medications prior to visit.      ROS Review of Systems  Constitutional: Negative for chills, fever and malaise/fatigue.  Eyes: Negative for blurred vision.  Respiratory: Positive for wheezing. Negative for shortness of breath.   Cardiovascular: Negative for chest pain and palpitations.  Gastrointestinal: Negative for abdominal pain and nausea.  Genitourinary: Negative for dysuria and hematuria.  Musculoskeletal: Positive for joint pain. Negative for myalgias.  Skin: Negative for rash.  Neurological:  Positive for tingling. Negative for headaches.  Psychiatric/Behavioral: Negative for depression. The patient is not nervous/anxious.     Objective:  BP 131/78 (BP Location: Right Arm, Patient Position: Sitting, Cuff Size: Large)   Pulse 74   Temp 97.9 F (36.6 C) (Oral)   Wt 184 lb 3.2 oz (83.6 kg)   SpO2 100%   BMI 34.80 kg/m   BP/Weight 02/10/2017 01/27/2017 4/85/4627  Systolic BP 035 009 381  Diastolic BP 78 70 90  Wt. (Lbs) 184.2 - 180  BMI 34.8 - 34.01      Physical Exam  Constitutional: She is oriented to person, place, and time.  Well developed, obese, NAD, polite  HENT:  Head:  Normocephalic and atraumatic.  Eyes: No scleral icterus.  Cardiovascular: Normal rate, regular rhythm and normal heart sounds.   Pulmonary/Chest: Effort normal and breath sounds normal.  Abdominal: Soft. Bowel sounds are normal. There is no tenderness.  Musculoskeletal: She exhibits no edema.  Neurological: She is alert and oriented to person, place, and time. No cranial nerve deficit. Coordination normal.  Positive Tinel's and Phalen's bilaterally. Positive Finkelstein's test on right.  Skin: Skin is warm and dry. No rash noted. No erythema. No pallor.  Psychiatric: She has a normal mood and affect. Her behavior is normal. Thought content normal.  Vitals reviewed.    Assessment & Plan:    1. Essential hypertension - Begin amLODipine (NORVASC) 10 MG tablet; Take 1 tablet (10 mg total) by mouth daily.  Dispense: 90 tablet; Refill: 1. Instructed to take half tab and assess further need to take whole tab. I have written down on AVS that I would like to optimally control BP to 115/75.  - aspirin EC 81 MG tablet; Take 1 tablet (81 mg total) by mouth daily.  Dispense: 90 tablet; Refill: 3  2. Bilateral carpal tunnel syndrome - Ambulatory referral to Neurology - Begin predniSONE (STERAPRED UNI-PAK 48 TAB) 10 MG (48) TBPK tablet; Take as directed  Dispense: 48 tablet; Refill: 0  3. Hyperglycemia - HgB A1c 5.4% in clinic today  4. Tenosynovitis, de Quervain - Begin predniSONE (STERAPRED UNI-PAK 48 TAB) 10 MG (48) TBPK tablet; Take as directed  Dispense: 48 tablet; Refill: 0  5. Mild asthma, unspecified whether complicated, unspecified whether persistent - Refill albuterol (PROVENTIL HFA;VENTOLIN HFA) 108 (90 Base) MCG/ACT inhaler; Inhale 1-2 puffs into the lungs every 6 (six) hours as needed for wheezing or shortness of breath.  Dispense: 1 Inhaler; Refill: 5   Meds ordered this encounter  Medications  . amLODipine (NORVASC) 10 MG tablet    Sig: Take 1 tablet (10 mg total) by mouth  daily.    Dispense:  90 tablet    Refill:  1    Order Specific Question:   Supervising Provider    Answer:   Tresa Garter [id number]  . aspirin EC 81 MG tablet    Sig: Take 1 tablet (81 mg total) by mouth daily.    Dispense:  90 tablet    Refill:  3    Order Specific Question:   Supervising Provider    Answer:   Tresa Garter [id number]  . predniSONE (STERAPRED UNI-PAK 48 TAB) 10 MG (48) TBPK tablet    Sig: Take as directed    Dispense:  48 tablet    Refill:  0    Order Specific Question:   Supervising Provider    Answer:   Tresa Garter [id number]  . losartan-hydrochlorothiazide (HYZAAR) 100-25 MG tablet  Sig: Take 1 tablet by mouth every morning.    Dispense:  90 tablet    Refill:  3    Order Specific Question:   Supervising Provider    Answer:   Tresa Garter [id number]  . albuterol (PROVENTIL HFA;VENTOLIN HFA) 108 (90 Base) MCG/ACT inhaler    Sig: Inhale 1-2 puffs into the lungs every 6 (six) hours as needed for wheezing or shortness of breath.    Dispense:  1 Inhaler    Refill:  5    Order Specific Question:   Supervising Provider    Answer:   Tresa Garter [id number]    Follow-up: Return in about 6 weeks (around 03/24/2017) for HTN.   Clent Demark PA

## 2017-02-11 LAB — COMPREHENSIVE METABOLIC PANEL
ALBUMIN: 4.4 g/dL (ref 3.5–5.5)
ALK PHOS: 108 IU/L (ref 39–117)
ALT: 32 IU/L (ref 0–32)
AST: 26 IU/L (ref 0–40)
Albumin/Globulin Ratio: 1.3 (ref 1.2–2.2)
BUN / CREAT RATIO: 22 (ref 9–23)
BUN: 15 mg/dL (ref 6–24)
Bilirubin Total: 0.4 mg/dL (ref 0.0–1.2)
CALCIUM: 9.3 mg/dL (ref 8.7–10.2)
CO2: 22 mmol/L (ref 20–29)
CREATININE: 0.67 mg/dL (ref 0.57–1.00)
Chloride: 106 mmol/L (ref 96–106)
GFR calc Af Amer: 123 mL/min/{1.73_m2} (ref >59)
GFR, EST NON AFRICAN AMERICAN: 106 mL/min/{1.73_m2} (ref >59)
GLOBULIN, TOTAL: 3.4 g/dL (ref 1.5–4.5)
Glucose: 87 mg/dL (ref 65–99)
Potassium: 3.7 mmol/L (ref 3.5–5.2)
SODIUM: 143 mmol/L (ref 134–144)
Total Protein: 7.8 g/dL (ref 6.0–8.5)

## 2017-02-11 LAB — CBC WITH DIFFERENTIAL/PLATELET
BASOS: 0 %
Basophils Absolute: 0 10*3/uL (ref 0.0–0.2)
EOS (ABSOLUTE): 0.3 10*3/uL (ref 0.0–0.4)
EOS: 5 %
HEMATOCRIT: 35.7 % (ref 34.0–46.6)
HEMOGLOBIN: 11.5 g/dL (ref 11.1–15.9)
Immature Grans (Abs): 0 10*3/uL (ref 0.0–0.1)
Immature Granulocytes: 0 %
LYMPHS ABS: 2.1 10*3/uL (ref 0.7–3.1)
LYMPHS: 37 %
MCH: 28.4 pg (ref 26.6–33.0)
MCHC: 32.2 g/dL (ref 31.5–35.7)
MCV: 88 fL (ref 79–97)
MONOCYTES: 4 %
Monocytes Absolute: 0.2 10*3/uL (ref 0.1–0.9)
Neutrophils Absolute: 3.1 10*3/uL (ref 1.4–7.0)
Neutrophils: 54 %
Platelets: 209 10*3/uL (ref 150–379)
RBC: 4.05 x10E6/uL (ref 3.77–5.28)
RDW: 14.4 % (ref 12.3–15.4)
WBC: 5.7 10*3/uL (ref 3.4–10.8)

## 2017-02-13 ENCOUNTER — Encounter: Payer: Self-pay | Admitting: Neurology

## 2017-02-28 ENCOUNTER — Ambulatory Visit (INDEPENDENT_AMBULATORY_CARE_PROVIDER_SITE_OTHER): Payer: Self-pay | Admitting: Neurology

## 2017-02-28 ENCOUNTER — Ambulatory Visit (INDEPENDENT_AMBULATORY_CARE_PROVIDER_SITE_OTHER): Payer: Medicaid Other | Admitting: Neurology

## 2017-02-28 ENCOUNTER — Encounter: Payer: Self-pay | Admitting: Neurology

## 2017-02-28 VITALS — BP 100/70 | HR 84 | Ht 61.0 in | Wt 185.2 lb

## 2017-02-28 DIAGNOSIS — G5603 Carpal tunnel syndrome, bilateral upper limbs: Secondary | ICD-10-CM | POA: Insufficient documentation

## 2017-02-28 DIAGNOSIS — G5622 Lesion of ulnar nerve, left upper limb: Secondary | ICD-10-CM

## 2017-02-28 NOTE — Progress Notes (Signed)
[facility] Clinic Note - Initial Visit   Date: 02/28/17  Yesenia Simmons MRN: [id number] DOB: 09-Oct-1970   Dear Dr. Altamease Oiler:  Thank you for your kind referral of Yesenia Simmons for consultation of bilateral wrist pain. Although her history is well known to you, please allow [place] to reiterate it for the purpose of our medical record. The patient was accompanied to the clinic by daughter who also provides collateral information.     History of Present Illness: Yesenia Simmons is a 46 y.o. right-handed African American female with asthma, hypertension, and sinus tachycardia presenting for evaluation of bilateral hand pain.    Starting in early summer 2018, she began having tingling over the thumbs and index finger which is constant and worse at night.  She has tried using an ice pack and running it under hot water which sometimes helps.  She complains of hand weakness and states that her grip is poor. Brushing her teeth in the morning is difficult, because she often drops the toothbrush.  She has been using wrist splints which does not help.  She was told she had CTS in 2006 based on clinical exam and did not have further investigations as her symptoms slowly improved. She has tried gabapentin 300mg  at bedtime which does not provide any relief.  She was also given oral steroids with no relief.  She denies any neck pain.    Out-side paper records, electronic medical record, and images have been reviewed where available and summarized as:  Lab Results  Component Value Date   HGBA1C 5.4 02/10/2017    Past Medical History:  Diagnosis Date  . Asthma   . CTS (carpal tunnel syndrome)   . Hypertension   . RBBB   . Sinus tachycardia     History reviewed. No pertinent surgical history.   Medications:  Outpatient Encounter Prescriptions as of 02/28/2017  Medication Sig Note  . albuterol (PROVENTIL HFA;VENTOLIN HFA) 108 (90 Base) MCG/ACT inhaler Inhale 1-2  puffs into the lungs every 6 (six) hours as needed for wheezing or shortness of breath.   Marland Kitchen amLODipine (NORVASC) 10 MG tablet Take 1 tablet (10 mg total) by mouth daily.   . Ascorbic Acid (CHEW-C PO) Take 1 tablet by mouth daily. ENERGY VITAMIN C   . aspirin EC 81 MG tablet Take 1 tablet (81 mg total) by mouth daily.   . cetirizine (ZYRTEC) 10 MG tablet Take 10 mg by mouth at bedtime. Take 1 tablet daily at bedtime   . Cholecalciferol (VITAMIN D3 MAXIMUM STRENGTH PO) Take 1 tablet by mouth daily at 3 pm. (1600) 11/15/2016: (1600)  . cyclobenzaprine (FLEXERIL) 10 MG tablet Take 10 mg by mouth at bedtime.   . gabapentin (NEURONTIN) 300 MG capsule Take 300 mg by mouth 2 (two) times daily.    Marland Kitchen ibuprofen (ADVIL,MOTRIN) 800 MG tablet Take 800 mg by mouth every 8 (eight) hours as needed (for pain.).    Marland Kitchen losartan-hydrochlorothiazide (HYZAAR) 100-25 MG tablet Take 1 tablet by mouth every morning.   . megestrol (MEGACE) 40 MG tablet Take 1 tablet (40 mg total) by mouth 2 (two) times daily. Can increase to two tablets twice a day in the event of heavy bleeding (Patient taking differently: Take 80 mg by mouth 2 (two) times daily. In the morning & at 1600)   . meloxicam (MOBIC) 7.5 MG tablet Take 1 tablet (7.5 mg total) by mouth daily.   . montelukast (SINGULAIR) 10 MG tablet Take 10  mg by mouth daily at 3 pm. (1600) 11/15/2016: (1600)  . oxyCODONE-acetaminophen (PERCOCET) 10-325 MG tablet Take 1 tablet by mouth every 6 (six) hours as needed for pain.   . traMADol (ULTRAM) 50 MG tablet Take 50 mg by mouth every 6 (six) hours as needed (for pain.).   Marland Kitchen zolpidem (AMBIEN) 10 MG tablet Take 10 mg by mouth at bedtime as needed for sleep.   . [DISCONTINUED] predniSONE (STERAPRED UNI-PAK 48 TAB) 10 MG (48) TBPK tablet Take as directed    No facility-administered encounter medications on file as of 02/28/2017.      Allergies:  Allergies  Allergen Reactions  . Other Hives  . Eggs Or Egg-Derived Products Hives  .  Fish Allergy Hives  . Iodine Hives    Family History: Family History  Problem Relation Age of Onset  . Stroke Maternal Grandmother   . Hypertension Mother   . Asthma Mother   . Hypertension Brother   . Hypertension Brother   . Heart attack Father   . Cancer Neg Hx     Social History: Social History  Substance Use Topics  . Smoking status: Never Smoker  . Smokeless tobacco: Never Used  . Alcohol use No   Social History   Social History Narrative   Lives with son in a 2 story home.  Has 3 children.  Currently in between jobs.  Education: high school.      Review of Systems:  CONSTITUTIONAL: No fevers, chills, night sweats, or weight loss.   EYES: No visual changes or eye pain ENT: No hearing changes.  No history of nose bleeds.   RESPIRATORY: No cough, wheezing and shortness of breath.   CARDIOVASCULAR: Negative for chest pain, and palpitations.   GI: Negative for abdominal discomfort, blood in stools or black stools.  No recent change in bowel habits.   GU:  No history of incontinence.   MUSCLOSKELETAL: No history of joint pain or swelling.  No myalgias.   SKIN: Negative for lesions, rash, and itching.   HEMATOLOGY/ONCOLOGY: Negative for prolonged bleeding, bruising easily, and swollen nodes.  No history of cancer.   ENDOCRINE: Negative for cold or heat intolerance, polydipsia or goiter.   PSYCH:  +depression or anxiety symptoms.   NEURO: As Above.   Vital Signs:  BP 100/70   Pulse 84   Ht 5\' 1"  (1.549 m)   Wt 185 lb 3 oz (84 kg)   SpO2 98%   BMI 34.99 kg/m    General Medical Exam:   General:  Well appearing, comfortable.   Eyes/ENT: see cranial nerve examination.   Neck: No masses appreciated.  Full range of motion without tenderness.  No carotid bruits. Respiratory:  Clear to auscultation, good air entry bilaterally.   Cardiac:  Regular rate and rhythm, no murmur.   Extremities:  No deformities, edema, or skin discoloration.  She tends to walk with her  right arm guarded Skin:  No rashes or lesions.  Neurological Exam: MENTAL STATUS including orientation to time, place, person, recent and remote memory, attention span and concentration, language, and fund of knowledge is normal.  Speech is not dysarthric.  CRANIAL NERVES: II:  No visual field defects.  Unremarkable fundi.   III-IV-VI: Pupils equal round and reactive to light.  Normal conjugate, extra-ocular eye movements in all directions of gaze.  No nystagmus.  No ptosis.   V:  Normal facial sensation.    VII:  Normal facial symmetry and movements.  VIII:  Normal  hearing and vestibular function.   IX-X:  Normal palatal movement.   XI:  Normal shoulder shrug and head rotation.   XII:  Normal tongue strength and range of motion, no deviation or fasciculation.  MOTOR:  Motor strength is 5/5 throughout, including bilateral abductor pollicis brevis.  No pronator drift.  Tone is normal.    MSRs:   Reflexes are 2+/4 throughout.    SENSORY:  Temperature is reduced over the palmer surface of both hands, pin prick, light touch, and vibration intact.  Tinel's sign is positive at the right wrist.  COORDINATION/GAIT: Normal finger-to- nose-finger.  Intact rapid alternating movements bilaterally.  Gait narrow based and stable.   IMPRESSION: Yesenia Simmons is a 46 year-old female referred for evaluation of bilateral hand paresthesias.  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 She is mostly bothered by her right CTS.  Because she has not had any improvement with conservative therapies, I will refer her to a hand surgeon for steroid injection and/or surgical consideration.  In the meantime, she was recommended to increase gabapentin to 300 mg twice daily and continue to use wrist splints.  The duration of this appointment visit was 30 minutes of face-to-face time with the patient.  Greater  than 50% of this time was spent in counseling, explanation of diagnosis, planning of further management, and coordination of care.   Thank you for allowing me to participate in patient's care.  If I can answer any additional questions, I would be pleased to do so.    Sincerely,    Donika K. Posey Pronto, DO

## 2017-02-28 NOTE — Procedures (Signed)
[facility]  [place], [place]  [place], [place] [address] Tel: [phone] Fax:  [phone] Test Date:  02/28/2017  Patient: Yesenia Simmons DOB: Nov 23, 1970 Physician: Narda Amber, DO  Sex: Female Height: 5\' 1"  Ref Phys: Narda Amber, DO  ID#: [id number] Temp: 37.4C Technician:    Patient Complaints: This is a 46 year old female referred for evaluation of bilateral hand pain and paresthesias.  NCV & EMG Findings: Extensive electrodiagnostic testing of the right upper extremity and additional studies of the left shows:  1. Right median sensory response shows prolonged latency (4.1 ms) and reduced amplitude (14.1 V).  Left median sensory response shows prolonged latency  (3.6 ms) and normal amplitude. Bilateral ulnar sensory responses are within normal limits.  2. Right median motor response shows prolonged latency (4.8 ms) and normal amplitude.  Right ulnar and left median motor responses are within normal limits. Left ulnar motor response shows conduction velocity slowing across the elbow with normal latency, amplitude, and no evidence of conduction block. 3. There is no evidence of active or chronic motor axon loss changes affecting any of the tested muscles. Of note, there was incomplete motor unit recruitment due to variable firing pattern in several muscles which were tested, these findings are most likely due to patient's pain or poor effort.  Impression: 1. Right median neuropathy at or distal to the wrist, consistent with the clinical diagnosis of carpal tunnel syndrome; moderate-to-severe in degree electrically. 2. Left median neuropathy at or distal to the wrist, consistent with clinical diagnosis of carpal tunnel syndrome; mild in degree electrically. 3. Left ulnar neuropathy with slowing across the elbow, purely demyelinating in type.   ___________________________ Narda Amber, DO    Nerve Conduction Studies Anti Sensory Summary Table   Site NR  Peak (ms) Norm Peak (ms) P-T Amp (V) Norm P-T Amp  Left Median Anti Sensory (2nd Digit)  37.4C  Wrist    3.6 <3.4 21.2 >20  Right Median Anti Sensory (2nd Digit)  37.4C  Wrist    4.1 <3.4 14.1 >20  Left Ulnar Anti Sensory (5th Digit)  37.4C  Wrist    2.8 <3.1 22.3 >12  Right Ulnar Anti Sensory (5th Digit)  37.4C  Wrist    2.5 <3.1 23.4 >12   Motor Summary Table   Site NR Onset (ms) Norm Onset (ms) O-P Amp (mV) Norm O-P Amp Site1 Site2 Delta-0 (ms) Dist (cm) Vel (m/s) Norm Vel (m/s)  Left Median Motor (Abd Poll Brev)  37.4C  Wrist    3.3 <3.9 13.2 >6 Elbow Wrist 4.4 26.0 59 >50  Elbow    7.7  12.5         Right Median Motor (Abd Poll Brev)  37.4C  Wrist    4.8 <3.9 7.7 >6 Elbow Wrist 4.2 26.0 62 >50  Elbow    9.0  7.3         Left Ulnar Motor (Abd Dig Minimi)  37.4C  Wrist    2.2 <3.1 9.2 >7 B Elbow Wrist 3.0 22.0 73 >50  B Elbow    5.2  8.3  A Elbow B Elbow 2.1 10.0 48 >50  A Elbow    7.3  8.1         Right Ulnar Motor (Abd Dig Minimi)  37.4C  Wrist    1.8 <3.1 9.3 >7 B Elbow Wrist 3.4 22.0 65 >50  B Elbow    5.2  9.0  A Elbow B Elbow 1.5 10.0 67 >50  A Elbow    6.7  8.7          EMG   Side Muscle Ins Act Fibs Psw Fasc Number Recrt Dur Dur. Amp Amp. Poly Poly. Comment  Right 1stDorInt Nml Nml Nml Nml 1- Mod-V Nml Nml Nml Nml Nml Nml N/A  Right Abd Poll Brev Nml Nml Nml Nml 1- Mod-V Nml Nml Nml Nml Nml Nml N/A  Right PronatorTeres Nml Nml Nml Nml 1- Mod-V Nml Nml Nml Nml Nml Nml N/A  Right Biceps Nml Nml Nml Nml Nml Nml Nml Nml Nml Nml Nml Nml N/A  Right Triceps Nml Nml Nml Nml Nml Nml Nml Nml Nml Nml Nml Nml N/A  Right Deltoid Nml Nml Nml Nml Nml Nml Nml Nml Nml Nml Nml Nml N/A  Left 1stDorInt Nml Nml Nml Nml 1- Mod-V Nml Nml Nml Nml Nml Nml N/A  Left Abd Poll Brev Nml Nml Nml Nml 1- Mod-V Nml Nml Nml Nml Nml Nml N/A  Left PronatorTeres Nml Nml Nml Nml Nml Nml Nml Nml Nml Nml Nml Nml N/A  Left FlexCarpiUln Nml Nml Nml Nml 1- Mod-V Nml Nml Nml Nml Nml Nml N/A       Waveforms:

## 2017-03-04 NOTE — Progress Notes (Signed)
Referral faxed

## 2017-03-11 ENCOUNTER — Telehealth: Payer: Self-pay | Admitting: Neurology

## 2017-03-11 NOTE — Telephone Encounter (Signed)
Pt called and wanted to know what she can take for the pain and if Dr Posey Pronto could prescribe something

## 2017-03-11 NOTE — Telephone Encounter (Signed)
Last ov note says to increase gabapentin to 300 mg bid.  Please advise.

## 2017-03-11 NOTE — Telephone Encounter (Signed)
Left message giving patient instructions and requested for her to call me if she needs a refill.

## 2017-03-11 NOTE — Telephone Encounter (Signed)
OK to increase gabapentin to 300mg  TID.  Donika K. Posey Pronto, DO

## 2017-03-24 ENCOUNTER — Ambulatory Visit (INDEPENDENT_AMBULATORY_CARE_PROVIDER_SITE_OTHER): Payer: Medicaid Other | Admitting: Physician Assistant

## 2017-03-31 ENCOUNTER — Telehealth: Payer: Self-pay | Admitting: Neurology

## 2017-03-31 ENCOUNTER — Other Ambulatory Visit: Payer: Self-pay | Admitting: *Deleted

## 2017-03-31 ENCOUNTER — Ambulatory Visit: Payer: Medicaid Other | Admitting: Neurology

## 2017-03-31 MED ORDER — GABAPENTIN 300 MG PO CAPS: 300.0000 mg | ORAL_CAPSULE | Freq: Three times a day (TID) | ORAL | 5 refills | Status: DC

## 2017-03-31 NOTE — Telephone Encounter (Signed)
Needs refill on Gabapentin sent to [facility] pharmacy on [address]. Also, she has not heard anything regarding her referral to a Surgeon. Please Call. Thanks

## 2017-04-01 ENCOUNTER — Ambulatory Visit (INDEPENDENT_AMBULATORY_CARE_PROVIDER_SITE_OTHER): Payer: Self-pay | Admitting: Physician Assistant

## 2017-04-01 ENCOUNTER — Encounter (INDEPENDENT_AMBULATORY_CARE_PROVIDER_SITE_OTHER): Payer: Self-pay | Admitting: Physician Assistant

## 2017-04-01 ENCOUNTER — Telehealth (INDEPENDENT_AMBULATORY_CARE_PROVIDER_SITE_OTHER): Payer: Self-pay

## 2017-04-01 ENCOUNTER — Ambulatory Visit (HOSPITAL_COMMUNITY)
Admission: RE | Admit: 2017-04-01 | Discharge: 2017-04-01 | Disposition: A | Discharge status: Home / Self Care | Payer: Self-pay | Source: Ambulatory Visit | Attending: Physician Assistant | Admitting: Physician Assistant

## 2017-04-01 ENCOUNTER — Telehealth: Payer: Self-pay | Admitting: Neurology

## 2017-04-01 VITALS — BP 115/72 | HR 74 | Temp 98.1°F | Wt 184.4 lb

## 2017-04-01 DIAGNOSIS — G5603 Carpal tunnel syndrome, bilateral upper limbs: Secondary | ICD-10-CM

## 2017-04-01 DIAGNOSIS — M25572 Pain in left ankle and joints of left foot: Secondary | ICD-10-CM | POA: Insufficient documentation

## 2017-04-01 DIAGNOSIS — Z760 Encounter for issue of repeat prescription: Secondary | ICD-10-CM

## 2017-04-01 DIAGNOSIS — Z029 Encounter for administrative examinations, unspecified: Secondary | ICD-10-CM

## 2017-04-01 MED ORDER — AMLODIPINE BESYLATE 10 MG PO TABS: 10.0000 mg | ORAL_TABLET | Freq: Every day | ORAL | 3 refills | Status: DC

## 2017-04-01 MED ORDER — LOSARTAN POTASSIUM-HCTZ 100-25 MG PO TABS: 1.0000 | ORAL_TABLET | ORAL | 3 refills | Status: DC

## 2017-04-01 MED ORDER — NAPROXEN 500 MG PO TABS: 500.0000 mg | ORAL_TABLET | Freq: Two times a day (BID) | ORAL | 0 refills | Status: DC

## 2017-04-01 MED ORDER — ASPIRIN EC 81 MG PO TBEC: 81.0000 mg | DELAYED_RELEASE_TABLET | Freq: Every day | ORAL | 3 refills | Status: AC

## 2017-04-01 NOTE — Telephone Encounter (Signed)
Rx sent in and new referral sent to [facility].

## 2017-04-01 NOTE — Telephone Encounter (Signed)
Patient aware of normal xray. Nat Christen, CMA

## 2017-04-01 NOTE — Telephone Encounter (Signed)
Patient wants to speak to some one about a referral and a letter for disability

## 2017-04-01 NOTE — Telephone Encounter (Signed)
-----   Message from Clent Demark, PA-C sent at 04/01/2017  4:52 PM EDT ----- Normal x-ray. Please notify patient. Thank you.

## 2017-04-01 NOTE — Progress Notes (Signed)
Pt fell on Sunday and has some swelling in her feet, left foot worse than right

## 2017-04-01 NOTE — Progress Notes (Signed)
Subjective:  Patient ID: Yesenia Simmons, female    DOB: [birth year]  Age: 47 y.o. MRN: [id number]  CC:  Left ankle pain  HPI  Yesenia Simmons is a 46 y.o. female with a PMH of asthma, CTS, HTN, RBBB, and sinus tachycardia presents to discuss disability and what her next steps for CTS treatment are. Also wants to know if she should continue taking her anti-hypertensives because she ran out. Says pharmacy is calling her to go pick up refills but does not know for which medications. Note by Dr. Posey Pronto, Neurologist, confirms bilateral CTS and has sent patient to hand surgeon. Pt states she has not heard from the hand surgeon or neurologist. Has not called to find out about her hand surgeon referral.    2 day history of left ankle eversion injury. Has mild edema. Pain currently 8/10 and located primarily across the deltoid ligament. Has been elevating her foot and applied ice once. Says she is unable to bear weight 2/2 pain.      Outpatient Medications Prior to Visit  Medication Sig Dispense Refill  . amLODipine (NORVASC) 10 MG tablet Take 1 tablet (10 mg total) by mouth daily. 90 tablet 1  . Ascorbic Acid (CHEW-C PO) Take 1 tablet by mouth daily. ENERGY VITAMIN C    . aspirin EC 81 MG tablet Take 1 tablet (81 mg total) by mouth daily. 90 tablet 3  . cetirizine (ZYRTEC) 10 MG tablet Take 10 mg by mouth at bedtime. Take 1 tablet daily at bedtime    . Cholecalciferol (VITAMIN D3 MAXIMUM STRENGTH PO) Take 1 tablet by mouth daily at 3 pm. (1600)    . cyclobenzaprine (FLEXERIL) 10 MG tablet Take 10 mg by mouth at bedtime.  5  . gabapentin (NEURONTIN) 300 MG capsule Take 1 capsule (300 mg total) by mouth 3 (three) times daily. 90 capsule 5  . ibuprofen (ADVIL,MOTRIN) 800 MG tablet Take 800 mg by mouth every 8 (eight) hours as needed (for pain.).     Marland Kitchen losartan-hydrochlorothiazide (HYZAAR) 100-25 MG tablet Take 1 tablet by mouth every morning. 90 tablet 3  . megestrol (MEGACE) 40 MG tablet Take 1  tablet (40 mg total) by mouth 2 (two) times daily. Can increase to two tablets twice a day in the event of heavy bleeding (Patient taking differently: Take 80 mg by mouth 2 (two) times daily. In the morning & at 1600) 60 tablet 2  . meloxicam (MOBIC) 7.5 MG tablet Take 1 tablet (7.5 mg total) by mouth daily. 15 tablet 0  . montelukast (SINGULAIR) 10 MG tablet Take 10 mg by mouth daily at 3 pm. (1600)    . zolpidem (AMBIEN) 10 MG tablet Take 10 mg by mouth at bedtime as needed for sleep.    Marland Kitchen albuterol (PROVENTIL HFA;VENTOLIN HFA) 108 (90 Base) MCG/ACT inhaler Inhale 1-2 puffs into the lungs every 6 (six) hours as needed for wheezing or shortness of breath. 1 Inhaler 5  . oxyCODONE-acetaminophen (PERCOCET) 10-325 MG tablet Take 1 tablet by mouth every 6 (six) hours as needed for pain.    . traMADol (ULTRAM) 50 MG tablet Take 50 mg by mouth every 6 (six) hours as needed (for pain.).     No facility-administered medications prior to visit.      ROS Review of Systems  Constitutional: Negative for chills, fever and malaise/fatigue.  Eyes: Negative for blurred vision.  Respiratory: Negative for shortness of breath.   Cardiovascular: Negative for chest pain and palpitations.  Gastrointestinal: Negative for abdominal pain and nausea.  Genitourinary: Negative for dysuria and hematuria.  Musculoskeletal: Positive for joint pain. Negative for myalgias.       Bilateral hand pain  Skin: Negative for rash.  Neurological: Negative for tingling and headaches.  Psychiatric/Behavioral: Negative for depression. The patient is not nervous/anxious.     Objective:  BP 115/72 (BP Location: Right Arm, Patient Position: Sitting, Cuff Size: Large)   Pulse 74   Temp 98.1 F (36.7 C) (Oral)   Wt 184 lb 6.4 oz (83.6 kg)   SpO2 97%   BMI 34.84 kg/m   BP/Weight 04/01/2017 02/28/2017 8/33/8250  Systolic BP 539 767 341  Diastolic BP 72 70 78  Wt. (Lbs) 184.4 185.19 184.2  BMI 34.84 34.99 34.8       Physical Exam  Constitutional: She is oriented to person, place, and time.  Well developed, obese, NAD, not well versed  HENT:  Head: Normocephalic and atraumatic.  Eyes: No scleral icterus.  Pulmonary/Chest: Effort normal.  Musculoskeletal:  Very mild edema across the talus region of the left ankle. TTP on the medial and lateral malleolus. TTP greatest along the distribution of the deltoid ligament.  Neurological: She is alert and oriented to person, place, and time. No cranial nerve deficit. Coordination normal.  Skin: Skin is warm and dry. No rash noted. No erythema. No pallor.  Psychiatric: She has a normal mood and affect. Her behavior is normal. Thought content normal.  Vitals reviewed.    Assessment & Plan:    1. Acute left ankle pain - DG Ankle Complete Left; Future - DME Crutches - Begin naproxen (NAPROSYN) 500 MG tablet; Take 1 tablet (500 mg total) by mouth 2 (two) times daily with a meal.  Dispense: 30 tablet; Refill: 0 - Rest, Ice, Compress, and Elevate - Ankle wrapped in 4 inch Coban here in clinic  2. Medication refill - losartan-hydrochlorothiazide (HYZAAR) 100-25 MG tablet; Take 1 tablet by mouth every morning.  Dispense: 90 tablet; Refill: 3 -  amLODipine (NORVASC) 10 MG tablet; Take 1 tablet (10 mg total) by mouth daily.  Dispense: 90 tablet; Refill: 3 - aspirin EC 81 MG tablet; Take 1 tablet (81 mg total) by mouth daily.  Dispense: 90 tablet; Refill: 3  3. Bilateral Carpal Tunnel Syndrome - I have reviewed Dr. Serita Grit plan with patient and advised she call the neurologist's office to find out about Dr. Serita Grit referral to hand surgeon.   4. Administrative encounter - Patient requested letter to be written to the office of disability. Letter written, please refer to the "Letter" tab.    Meds ordered this encounter  Medications  . losartan-hydrochlorothiazide (HYZAAR) 100-25 MG tablet    Sig: Take 1 tablet by mouth every morning.    Dispense:  90  tablet    Refill:  3    Order Specific Question:   Supervising Provider    Answer:   Tresa Garter [id number]  . amLODipine (NORVASC) 10 MG tablet    Sig: Take 1 tablet (10 mg total) by mouth daily.    Dispense:  90 tablet    Refill:  3    Order Specific Question:   Supervising Provider    Answer:   Tresa Garter [id number]  . aspirin EC 81 MG tablet    Sig: Take 1 tablet (81 mg total) by mouth daily.    Dispense:  90 tablet    Refill:  3    Order Specific Question:   Supervising  Provider    Answer:   Tresa Garter [[id number]]  . naproxen (NAPROSYN) 500 MG tablet    Sig: Take 1 tablet (500 mg total) by mouth 2 (two) times daily with a meal.    Dispense:  30 tablet    Refill:  0    Order Specific Question:   Supervising Provider    Answer:   Tresa Garter [[id number]]    Follow-up: Return in about 4 weeks (around 04/29/2017) for CTS.   Clent Demark PA

## 2017-04-01 NOTE — Patient Instructions (Signed)
Carpal Tunnel Syndrome Carpal tunnel syndrome is a condition that causes pain in your hand and arm. The carpal tunnel is a narrow area that is on the palm side of your wrist. Repeated wrist motion or certain diseases may cause swelling in the tunnel. This swelling can pinch the main nerve in the wrist (median nerve). Follow these instructions at home: If you have a splint:  Wear it as told by your doctor. Remove it only as told by your doctor.  Loosen the splint if your fingers: ? Become numb and tingle. ? Turn blue and cold.  Keep the splint clean and dry. General instructions  Take over-the-counter and prescription medicines only as told by your doctor.  Rest your wrist from any activity that may be causing your pain. If needed, talk to your employer about changes that can be made in your work, such as getting a wrist pad to use while typing.  If directed, apply ice to the painful area: ? Put ice in a plastic bag. ? Place a towel between your skin and the bag. ? Leave the ice on for 20 minutes, 2-3 times per day.  Keep all follow-up visits as told by your doctor. This is important.  Do any exercises as told by your doctor, physical therapist, or occupational therapist. Contact a doctor if:  You have new symptoms.  Medicine does not help your pain.  Your symptoms get worse. This information is not intended to replace advice given to you by your health care provider. Make sure you discuss any questions you have with your health care provider. Document Released: 06/06/2011 Document Revised: 11/23/2015 Document Reviewed: 11/02/2014 Elsevier Interactive Patient Education  2018 [employer] Inc[employer]  

## 2017-04-02 NOTE — Telephone Encounter (Signed)
I spoke with patient and let her know that I have sent another referral for the hand surgeon.  She will call me if she does not hear from them by Friday.

## 2017-04-07 ENCOUNTER — Telehealth: Payer: Self-pay | Admitting: Neurology

## 2017-04-07 NOTE — Telephone Encounter (Signed)
Pt called and said she has not heard anything about her referral for surgery

## 2017-04-08 NOTE — Telephone Encounter (Signed)
Pt called and said she has not heard anything about being scheduled for surgery, please call and advise

## 2017-04-09 NOTE — Telephone Encounter (Signed)
Patient notified that referral has been sent and she was given phone number.

## 2017-04-16 ENCOUNTER — Telehealth: Payer: Self-pay | Admitting: *Deleted

## 2017-04-16 NOTE — Telephone Encounter (Signed)
Patient referred to [facility]  Received fax from them that patient does not have insurance at this time and is going to wait to see if she is approved for Medicare/Medicaid before scheduling.

## 2017-04-29 ENCOUNTER — Ambulatory Visit (INDEPENDENT_AMBULATORY_CARE_PROVIDER_SITE_OTHER): Payer: Self-pay | Admitting: Physician Assistant

## 2017-04-29 ENCOUNTER — Encounter (INDEPENDENT_AMBULATORY_CARE_PROVIDER_SITE_OTHER): Payer: Self-pay | Admitting: Physician Assistant

## 2017-04-29 VITALS — BP 116/76 | HR 66 | Temp 98.2°F | Wt 188.0 lb

## 2017-04-29 DIAGNOSIS — G5603 Carpal tunnel syndrome, bilateral upper limbs: Secondary | ICD-10-CM

## 2017-04-29 DIAGNOSIS — Z029 Encounter for administrative examinations, unspecified: Secondary | ICD-10-CM

## 2017-04-29 NOTE — Progress Notes (Signed)
Subjective:  Patient ID: Yesenia Simmons, female    DOB: 02/05/1971  Age: 46 y.o. MRN: [id number]  CC: needs letter for social services   HPI  Yesenia Simmons a 46 y.o.femalewith a PMH of asthma, CTS, HTN, RBBB, and sinus tachycardia presents to request a letter be written to social services to have her electricity bills postponed due to inability to work 2/2 CTS. Pt was referred to the hand surgeon and is awaiting initial appointment. Reason for prolonged wait is due to lack of insurance. Patient working with social services to approve temporary Medicaid in order for patient to have surgery done. CTS pain and numbness remain moderately severe R > L. Uses wrist splints at night. NSAIDs used to minimal effect.      Outpatient Medications Prior to Visit  Medication Sig Dispense Refill  . albuterol (PROVENTIL HFA;VENTOLIN HFA) 108 (90 Base) MCG/ACT inhaler Inhale 1-2 puffs into the lungs every 6 (six) hours as needed for wheezing or shortness of breath. 1 Inhaler 5  . amLODipine (NORVASC) 10 MG tablet Take 1 tablet (10 mg total) by mouth daily. 90 tablet 3  . Ascorbic Acid (CHEW-C PO) Take 1 tablet by mouth daily. ENERGY VITAMIN C    . aspirin EC 81 MG tablet Take 1 tablet (81 mg total) by mouth daily. 90 tablet 3  . cetirizine (ZYRTEC) 10 MG tablet Take 10 mg by mouth at bedtime. Take 1 tablet daily at bedtime    . Cholecalciferol (VITAMIN D3 MAXIMUM STRENGTH PO) Take 1 tablet by mouth daily at 3 pm. (1600)    . gabapentin (NEURONTIN) 300 MG capsule Take 1 capsule (300 mg total) by mouth 3 (three) times daily. 90 capsule 5  . losartan-hydrochlorothiazide (HYZAAR) 100-25 MG tablet Take 1 tablet by mouth every morning. 90 tablet 3  . megestrol (MEGACE) 40 MG tablet Take 1 tablet (40 mg total) by mouth 2 (two) times daily. Can increase to two tablets twice a day in the event of heavy bleeding (Patient taking differently: Take 80 mg by mouth 2 (two) times daily. In the morning & at 1600) 60  tablet 2  . montelukast (SINGULAIR) 10 MG tablet Take 10 mg by mouth daily at 3 pm. (1600)    . naproxen (NAPROSYN) 500 MG tablet Take 1 tablet (500 mg total) by mouth 2 (two) times daily with a meal. 30 tablet 0  . zolpidem (AMBIEN) 10 MG tablet Take 10 mg by mouth at bedtime as needed for sleep.     No facility-administered medications prior to visit.      ROS Review of Systems  Constitutional: Negative for chills, fever and malaise/fatigue.  Eyes: Negative for blurred vision.  Respiratory: Negative for shortness of breath.   Cardiovascular: Negative for chest pain and palpitations.  Gastrointestinal: Negative for abdominal pain and nausea.  Genitourinary: Negative for dysuria and hematuria.  Musculoskeletal: Negative for joint pain and myalgias.  Skin: Negative for rash.  Neurological: Negative for tingling and headaches.       R > L numbness to the hands  Psychiatric/Behavioral: Negative for depression. The patient is not nervous/anxious.     Objective:  BP 116/76 (BP Location: Left Arm, Patient Position: Sitting, Cuff Size: Large)   Pulse 66   Temp 98.2 F (36.8 C) (Oral)   Wt 188 lb (85.3 kg)   SpO2 98%   BMI 35.52 kg/m   BP/Weight 04/29/2017 04/01/2017 09/13/1759  Systolic BP 607 371 062  Diastolic BP 76 72 70  Wt. (Lbs) 188 184.4 185.19  BMI 35.52 34.84 34.99      Physical Exam  Constitutional: She is oriented to person, place, and time.  Well developed, obese, NAD, polite, nails well groomed  HENT:  Head: Normocephalic and atraumatic.  Eyes: No scleral icterus.  Neck: Normal range of motion.  Pulmonary/Chest: Effort normal.  Abdominal: Soft. Bowel sounds are normal. There is no tenderness.  Musculoskeletal: She exhibits no edema.  Full aROM of wrists and fingers but pain and tenderness to palpation are noted  Neurological: She is alert and oriented to person, place, and time. No cranial nerve deficit. Coordination normal.  Skin: Skin is warm and dry. No  rash noted. No erythema. No pallor.  Psychiatric: She has a normal mood and affect. Her behavior is normal. Thought content normal.  Vitals reviewed.    Assessment & Plan:   1. Administrative encounter - Letter written to Social Services to consider extending payment due date for electrical, heating, and cooling services.   2. Bilateral carpal tunnel syndrome - R > L.  She underwent NCS/EMG of both hands which showed moderate-to-severe right carpal tunnel and mild left CTS. She has been referred to the hand surgeon and is awaiting initial appointment. Reason for prolonged wait is due to lack of insurance. Patient working with social services to approve temporary Medicaid in order for patient to have surgery done.    Follow-up: Return if symptoms worsen or fail to improve.   Clent Demark PA

## 2017-04-29 NOTE — Patient Instructions (Signed)
Carpal Tunnel Release Carpal tunnel release is a surgical procedure to relieve numbness and pain in your hand that are caused by carpal tunnel syndrome. Your carpal tunnel is a narrow, hollow space in your wrist. It passes between your wrist bones and a band of connective tissue (transverse carpal ligament). The nerve that supplies most of your hand (median nerve) passes through this space, and so do the connections between your fingers and the muscles of your arm (tendons). Carpal tunnel syndrome makes this space swell and become narrow, and this causes pain and numbness. In carpal tunnel release surgery, a surgeon cuts through the transverse carpal ligament to make more room in the carpal tunnel space. You may have this surgery if other types of treatment have not worked. Tell a health care provider about:  Any allergies you have.  All medicines you are taking, including vitamins, herbs, eye drops, creams, and over-the-counter medicines.  Any problems you or family members have had with anesthetic medicines.  Any blood disorders you have.  Any surgeries you have had.  Any medical conditions you have. What are the risks? Generally, this is a safe procedure. However, problems may occur, including:  Bleeding.  Infection.  Injury to the median nerve.  Need for additional surgery.  What happens before the procedure?  Ask your health care provider about: ? Changing or stopping your regular medicines. This is especially important if you are taking diabetes medicines or blood thinners. ? Taking medicines such as aspirin and ibuprofen. These medicines can thin your blood. Do not take these medicines before your procedure if your health care provider instructs you not to.  Do not eat or drink anything after midnight on the night before the procedure or as directed by your health care provider.  Plan to have someone take you home after the procedure. What happens during the  procedure?  An IV tube may be inserted into a vein.  You will be given one of the following: ? A medicine that numbs the wrist area (local anesthetic). You may also be given a medicine to make you relax (sedative). ? A medicine that makes you go to sleep (general anesthetic).  Your arm, hand, and wrist will be cleaned with a germ-killing solution (antiseptic).  Your surgeon will make a surgical cut (incision) over the palm side of your wrist. The surgeon will pull aside the skin of your wrist to expose the carpal tunnel space.  The surgeon will cut the transverse carpal ligament.  The edges of the incision will be closed with stitches (sutures) or staples.  A bandage (dressing) will be placed over your wrist and wrapped around your hand and wrist. What happens after the procedure?  You may spend some time in a recovery area.  Your blood pressure, heart rate, breathing rate, and blood oxygen level will be monitored often until the medicines you were given have worn off.  You will likely have some pain. You will be given pain medicine.  You may need to wear a splint or a wrist brace over your dressing. This information is not intended to replace advice given to you by your health care provider. Make sure you discuss any questions you have with your health care provider. Document Released: 09/07/2003 Document Revised: 11/23/2015 Document Reviewed: 02/02/2014 Elsevier Interactive Patient Education  2017 [employer].

## 2017-11-19 ENCOUNTER — Other Ambulatory Visit: Payer: Self-pay

## 2017-11-19 ENCOUNTER — Emergency Department (HOSPITAL_COMMUNITY)
Admission: EM | Admit: 2017-11-19 | Discharge: 2017-11-19 | Disposition: A | Discharge status: Home / Self Care | Payer: Self-pay | Attending: Physician Assistant | Admitting: Physician Assistant

## 2017-11-19 ENCOUNTER — Emergency Department (HOSPITAL_COMMUNITY): Payer: Self-pay

## 2017-11-19 ENCOUNTER — Encounter (HOSPITAL_COMMUNITY): Payer: Self-pay | Admitting: Emergency Medicine

## 2017-11-19 DIAGNOSIS — I10 Essential (primary) hypertension: Secondary | ICD-10-CM | POA: Insufficient documentation

## 2017-11-19 DIAGNOSIS — J069 Acute upper respiratory infection, unspecified: Secondary | ICD-10-CM | POA: Insufficient documentation

## 2017-11-19 DIAGNOSIS — Z79899 Other long term (current) drug therapy: Secondary | ICD-10-CM | POA: Insufficient documentation

## 2017-11-19 DIAGNOSIS — Z7982 Long term (current) use of aspirin: Secondary | ICD-10-CM | POA: Insufficient documentation

## 2017-11-19 DIAGNOSIS — B9789 Other viral agents as the cause of diseases classified elsewhere: Secondary | ICD-10-CM

## 2017-11-19 DIAGNOSIS — J019 Acute sinusitis, unspecified: Secondary | ICD-10-CM | POA: Insufficient documentation

## 2017-11-19 DIAGNOSIS — J45909 Unspecified asthma, uncomplicated: Secondary | ICD-10-CM | POA: Insufficient documentation

## 2017-11-19 MED ORDER — GUAIFENESIN-CODEINE 100-10 MG/5ML PO SOLN: 5.0000 mL | Freq: Three times a day (TID) | ORAL | 0 refills | Status: DC | PRN

## 2017-11-19 MED ORDER — AMOXICILLIN-POT CLAVULANATE 875-125 MG PO TABS: 1.0000 | ORAL_TABLET | Freq: Two times a day (BID) | ORAL | 0 refills | Status: DC

## 2017-11-19 MED ORDER — BENZONATATE 100 MG PO CAPS: 200.0000 mg | ORAL_CAPSULE | Freq: Three times a day (TID) | ORAL | 0 refills | Status: DC

## 2017-11-19 NOTE — ED Provider Notes (Signed)
[facility] DEPT Provider Note   CSN: [id number] Arrival date & time: 11/19/17  6213     History   Chief Complaint Chief Complaint  Patient presents with  . Cough    HPI Yesenia Simmons is a 47 y.o. female with a past medical history of asthma, who presents to ED for evaluation of 10-day history of cough productive with yellow mucus, rhinorrhea, sinus pressure.  Symptoms began when she went out of town to visit family.  She states that the weather there was really cold so she believes the weather changes was causing her symptoms.  She reports intermittent wheezing that improves with her home albuterol inhaler.  She has tried NyQuil, DayQuil, TheraFlu with no improvement in her symptoms.  She denies any chest pain, shortness of breath, hemoptysis, leg swelling, ear pain, vomiting.  HPI  Past Medical History:  Diagnosis Date  . Asthma   . CTS (carpal tunnel syndrome)   . Hypertension   . RBBB   . Sinus tachycardia     Patient Active Problem List   Diagnosis Date Noted  . Bilateral carpal tunnel syndrome 02/28/2017    History reviewed. No pertinent surgical history.   OB History    Gravida  5   Para  3   Term  2   Preterm  1   AB  2   Living        SAB  2   TAB      Ectopic      Multiple      Live Births  3            Home Medications    Prior to Admission medications   Medication Sig Start Date End Date Taking? Authorizing Provider  albuterol (PROVENTIL HFA;VENTOLIN HFA) 108 (90 Base) MCG/ACT inhaler Inhale 1-2 puffs into the lungs every 6 (six) hours as needed for wheezing or shortness of breath. 02/10/17  Yes Clent Demark, PA-C  amLODipine (NORVASC) 10 MG tablet Take 1 tablet (10 mg total) by mouth daily. 04/01/17  Yes Clent Demark, PA-C  aspirin EC 81 MG tablet Take 1 tablet (81 mg total) by mouth daily. 04/01/17  Yes Clent Demark, PA-C  cetirizine (ZYRTEC) 10 MG tablet Take 10 mg by mouth at  bedtime. Take 1 tablet daily at bedtime   Yes [provider]  Cholecalciferol (VITAMIN D3 MAXIMUM STRENGTH PO) Take 1 tablet by mouth daily at 3 pm. (1600)   Yes [provider]  gabapentin (NEURONTIN) 300 MG capsule Take 1 capsule (300 mg total) by mouth 3 (three) times daily. 03/31/17  Yes Patel, Donika K, DO  losartan-hydrochlorothiazide (HYZAAR) 100-25 MG tablet Take 1 tablet by mouth every morning. 04/01/17  Yes Clent Demark, PA-C  megestrol (MEGACE) 40 MG tablet Take 1 tablet (40 mg total) by mouth 2 (two) times daily. Can increase to two tablets twice a day in the event of heavy bleeding Patient taking differently: Take 80 mg by mouth 2 (two) times daily as needed. In the morning & at 1600 09/20/16  Yes Harraway-Smith, Hoyle Sauer, MD  montelukast (SINGULAIR) 10 MG tablet Take 10 mg by mouth daily at 3 pm. (1600)   Yes [provider]  amoxicillin-clavulanate (AUGMENTIN) 875-125 MG tablet Take 1 tablet by mouth every 12 (twelve) hours. 11/19/17   Sharda Keddy, PA-C  benzonatate (TESSALON) 100 MG capsule Take 2 capsules (200 mg total) by mouth every 8 (eight) hours. 11/19/17   Shelly Coss,  Agnieszka Newhouse, PA-C  guaiFENesin-codeine 100-10 MG/5ML syrup Take 5 mLs by mouth 3 (three) times daily as needed for cough. 11/19/17   Delia Heady, PA-C    Family History Family History  Problem Relation Age of Onset  . Stroke Maternal Grandmother   . Hypertension Mother   . Asthma Mother   . Hypertension Brother   . Hypertension Brother   . Heart attack Father   . Cancer Neg Hx     Social History Social History   Tobacco Use  . Smoking status: Never Smoker  . Smokeless tobacco: Never Used  Substance Use Topics  . Alcohol use: No  . Drug use: No     Allergies   Other; Eggs or egg-derived products; Fish allergy; and Iodine   Review of Systems Review of Systems  HENT: Positive for congestion, rhinorrhea and sinus pressure. Negative for ear discharge, hearing loss, sore  throat and trouble swallowing.   Respiratory: Positive for cough. Negative for shortness of breath.   Cardiovascular: Negative for chest pain, palpitations and leg swelling.  Gastrointestinal: Negative for nausea and vomiting.  Neurological: Negative for headaches.     Physical Exam Updated Vital Signs BP 119/79 (BP Location: Left Arm)   Pulse 61   Temp 98.6 F (37 C) (Oral)   Resp 16   SpO2 100%   Physical Exam  Constitutional: She appears well-developed and well-nourished. No distress.  HENT:  Head: Normocephalic and atraumatic.  Right Ear: A middle ear effusion is present.  Left Ear: A middle ear effusion is present.  Nose: Rhinorrhea present. Right sinus exhibits maxillary sinus tenderness and frontal sinus tenderness. Left sinus exhibits maxillary sinus tenderness and frontal sinus tenderness.  Mouth/Throat: Uvula is midline and oropharynx is clear and moist.  Eyes: Conjunctivae and EOM are normal. No scleral icterus.  Neck: Normal range of motion.  Cardiovascular: Normal rate, regular rhythm and normal heart sounds.  Pulmonary/Chest: Effort normal and breath sounds normal. No respiratory distress.  Neurological: She is alert.  Skin: No rash noted. She is not diaphoretic.  Psychiatric: She has a normal mood and affect.  Nursing note and vitals reviewed.    ED Treatments / Results  Labs (all labs ordered are listed, but only abnormal results are displayed) Labs Reviewed - No data to display  EKG None  Radiology Dg Chest 2 View  Result Date: 11/19/2017 CLINICAL DATA:  Chest pain, cough. EXAM: CHEST - 2 VIEW COMPARISON:  Radiographs of April 01, 2016. FINDINGS: The heart size and mediastinal contours are within normal limits. Both lungs are clear. No pneumothorax or pleural effusion is noted. The visualized skeletal structures are unremarkable. IMPRESSION: No active cardiopulmonary disease. Electronically Signed   By: Marijo Conception, M.D.   On: 11/19/2017 08:55     Procedures Procedures (including critical care time)  Medications Ordered in ED Medications - No data to display   Initial Impression / Assessment and Plan / ED Course  I have reviewed the triage vital signs and the nursing notes.  Pertinent labs & imaging results that were available during my care of the patient were reviewed by me and considered in my medical decision making (see chart for details).     Patient presents to ED for evaluation of 10-day history of cough productive with yellow mucus, rhinorrhea and sinus pressure.  Symptoms began with the weather change when she went out of town to visit family.  No relief with over-the-counter medication.  On physical exam she is overall well-appearing.  Lungs are clear to auscultation bilaterally.  No wheezing noted.  Chest x-ray unremarkable.  She does have frontal and maxillary sinus tenderness to palpation and symptoms have been going on for 10 days.  Will treat with antitussives, Augmentin for sinusitis.  Advised to return to ED for any severe worsening symptoms.  Portions of this note were generated with [employer]. Dictation errors may occur despite best attempts at proofreading.   Final Clinical Impressions(s) / ED Diagnoses   Final diagnoses:  Viral URI with cough  Acute non-recurrent sinusitis, unspecified location    ED Discharge Orders        Ordered    guaiFENesin-codeine 100-10 MG/5ML syrup  3 times daily PRN     11/19/17 0902    benzonatate (TESSALON) 100 MG capsule  Every 8 hours     11/19/17 0902    amoxicillin-clavulanate (AUGMENTIN) 875-125 MG tablet  Every 12 hours     11/19/17 0903       Delia Heady, PA-C 11/19/17 0905    Macarthur Critchley, MD 11/19/17 1559

## 2017-11-19 NOTE — ED Notes (Signed)
ED Provider at bedside. 

## 2017-11-19 NOTE — ED Triage Notes (Signed)
Pt complaint of continued cough for 1.5 weeks unrelieved by cough syrup.

## 2017-11-19 NOTE — Discharge Instructions (Signed)
You have a sinus infection. Your chest x-ray was normal.

## 2017-11-19 NOTE — ED Notes (Addendum)
Patient ambulated to X-ray 

## 2017-11-28 ENCOUNTER — Telehealth (INDEPENDENT_AMBULATORY_CARE_PROVIDER_SITE_OTHER): Payer: Self-pay

## 2017-11-28 NOTE — Telephone Encounter (Signed)
Patient called stating her job needs her to take a UDS to ensure that the medications she is taking will not affect her ability to perform her job duties. Patient is Teacher, adult education, working 12 hr shifts. Please advise. Nat Christen, CMA

## 2017-11-30 NOTE — Telephone Encounter (Signed)
Please have patient make a lab appointment on her day off. Thank you

## 2017-12-01 NOTE — Telephone Encounter (Signed)
Attempted to call patient to inform. Phone rang several times and then disconnected. Will call back.

## 2017-12-02 NOTE — Telephone Encounter (Signed)
Patients home number is disconnected and mobile rings several times then disconnects. Nat Christen, CMA

## 2018-01-09 ENCOUNTER — Other Ambulatory Visit: Payer: Self-pay | Admitting: Neurology

## 2018-03-17 ENCOUNTER — Telehealth (INDEPENDENT_AMBULATORY_CARE_PROVIDER_SITE_OTHER): Payer: Self-pay | Admitting: Physician Assistant

## 2018-03-17 NOTE — Telephone Encounter (Signed)
Opened in error

## 2018-04-08 ENCOUNTER — Encounter (INDEPENDENT_AMBULATORY_CARE_PROVIDER_SITE_OTHER): Payer: Self-pay | Admitting: Physician Assistant

## 2018-04-08 ENCOUNTER — Other Ambulatory Visit: Payer: Self-pay

## 2018-04-08 ENCOUNTER — Ambulatory Visit (INDEPENDENT_AMBULATORY_CARE_PROVIDER_SITE_OTHER): Payer: Self-pay | Admitting: Physician Assistant

## 2018-04-08 VITALS — BP 109/75 | HR 79 | Temp 97.8°F | Ht 61.0 in | Wt 187.8 lb

## 2018-04-08 DIAGNOSIS — Z131 Encounter for screening for diabetes mellitus: Secondary | ICD-10-CM

## 2018-04-08 DIAGNOSIS — R5383 Other fatigue: Secondary | ICD-10-CM

## 2018-04-08 DIAGNOSIS — M549 Dorsalgia, unspecified: Secondary | ICD-10-CM

## 2018-04-08 DIAGNOSIS — M546 Pain in thoracic spine: Secondary | ICD-10-CM

## 2018-04-08 DIAGNOSIS — G43909 Migraine, unspecified, not intractable, without status migrainosus: Secondary | ICD-10-CM

## 2018-04-08 LAB — POCT GLYCOSYLATED HEMOGLOBIN (HGB A1C): HEMOGLOBIN A1C: 5.1 % (ref 4.0–5.6)

## 2018-04-08 MED ORDER — CYCLOBENZAPRINE HCL 10 MG PO TABS: 10.0000 mg | ORAL_TABLET | Freq: Three times a day (TID) | ORAL | 0 refills | Status: DC | PRN

## 2018-04-08 MED ORDER — CALTRATE 600+D PLUS MINERALS 600-800 MG-UNIT PO TABS: 2.0000 | ORAL_TABLET | Freq: Every day | ORAL | 5 refills | Status: DC

## 2018-04-08 MED ORDER — NAPROXEN 500 MG PO TABS: 500.0000 mg | ORAL_TABLET | Freq: Two times a day (BID) | ORAL | 0 refills | Status: DC

## 2018-04-08 NOTE — Patient Instructions (Signed)

## 2018-04-08 NOTE — Progress Notes (Signed)
Subjective:  Patient ID: Yesenia Simmons, female    DOB: [birth year]  Age: 47 y.o. MRN: [id number]  CC: back pain, hand pain, and headache  HPI  Yesenia Richters Thurmanis a 47 y.o.femalewith a PMH of asthma, CTS, HTN, RBBB, and sinus tachycardia presents with 3 -4 week hx of left frontal headache but also felt "all over" the head. Associated with photophobia and nausea. Occurs 2 -3 times per week. Has taken ibuprofen without relief. Rest in a dark place relieves the pain.     Has upper back and lower back pain since approximately a week and a half ago. Pain waxes and wanes. Does not endorse urinary incontinence, fecal incontinence, saddle paresthesia, paralysis, weakness, nuchal rigidity, or f/c/n/v.     Right hand pain attributed to CTS. Previous progress note Medicaid has not been approved and thus has not been scheduled for surgery. Does not know about CAFA.     Outpatient Medications Prior to Visit  Medication Sig Dispense Refill  . albuterol (PROVENTIL HFA;VENTOLIN HFA) 108 (90 Base) MCG/ACT inhaler Inhale 1-2 puffs into the lungs every 6 (six) hours as needed for wheezing or shortness of breath. 1 Inhaler 5  . aspirin EC 81 MG tablet Take 1 tablet (81 mg total) by mouth daily. 90 tablet 3  . Cholecalciferol (VITAMIN D3 MAXIMUM STRENGTH PO) Take 1 tablet by mouth daily at 3 pm. (1600)    . gabapentin (NEURONTIN) 300 MG capsule TAKE 1 CAPSULE BY MOUTH 3 TIMES DAILY 90 capsule 0  . losartan-hydrochlorothiazide (HYZAAR) 100-25 MG tablet Take 1 tablet by mouth every morning. 90 tablet 3  . megestrol (MEGACE) 40 MG tablet Take 1 tablet (40 mg total) by mouth 2 (two) times daily. Can increase to two tablets twice a day in the event of heavy bleeding (Patient taking differently: Take 80 mg by mouth 2 (two) times daily as needed. In the morning & at 1600) 60 tablet 2  . montelukast (SINGULAIR) 10 MG tablet Take 10 mg by mouth daily at 3 pm. (1600)    . amLODipine (NORVASC) 10 MG tablet Take 1 tablet  (10 mg total) by mouth daily. (Patient not taking: Reported on 04/08/2018) 90 tablet 3  . amoxicillin-clavulanate (AUGMENTIN) 875-125 MG tablet Take 1 tablet by mouth every 12 (twelve) hours. 14 tablet 0  . benzonatate (TESSALON) 100 MG capsule Take 2 capsules (200 mg total) by mouth every 8 (eight) hours. 30 capsule 0  . cetirizine (ZYRTEC) 10 MG tablet Take 10 mg by mouth at bedtime. Take 1 tablet daily at bedtime    . guaiFENesin-codeine 100-10 MG/5ML syrup Take 5 mLs by mouth 3 (three) times daily as needed for cough. 40 mL 0   No facility-administered medications prior to visit.      ROS Review of Systems  Constitutional: Negative for chills, fever and malaise/fatigue.  Eyes: Negative for blurred vision.  Respiratory: Negative for shortness of breath.   Cardiovascular: Negative for chest pain and palpitations.  Gastrointestinal: Negative for abdominal pain and nausea.  Genitourinary: Negative for dysuria and hematuria.  Musculoskeletal: Positive for back pain and neck pain. Negative for joint pain and myalgias.  Skin: Negative for rash.  Neurological: Positive for tingling (right leg). Negative for headaches.  Psychiatric/Behavioral: Negative for depression. The patient is not nervous/anxious.     Objective:  BP 109/75 (BP Location: Right Arm, Patient Position: Sitting, Cuff Size: Normal)   Pulse 79   Temp 97.8 F (36.6 C) (Oral)   Ht 5\' 1"  (  1.549 m)   Wt 187 lb 12.8 oz (85.2 kg)   SpO2 93%   BMI 35.48 kg/m   BP/Weight 04/08/2018 11/19/2017 16/04/9603  Systolic BP 540 981 191  Diastolic BP 75 73 76  Wt. (Lbs) 187.8 - 188  BMI 35.48 - 35.52      Physical Exam  Constitutional: She is oriented to person, place, and time.  Well developed, obese, NAD, polite  HENT:  Head: Normocephalic and atraumatic.  Eyes: No scleral icterus.  Neck: Normal range of motion. Neck supple. No thyromegaly present.  Cardiovascular: Normal rate, regular rhythm and normal heart sounds.  No  LE edema bilaterally  Pulmonary/Chest: Effort normal and breath sounds normal.  Abdominal: Soft. Bowel sounds are normal. There is tenderness (mild epigastric TTP).  Musculoskeletal: She exhibits no edema.  Increased muscular tonicity of the paraspinals of the C - T - L spine.  Neurological: She is alert and oriented to person, place, and time.  Skin: Skin is warm and dry. No rash noted. No erythema. No pallor.  Psychiatric: She has a normal mood and affect. Her behavior is normal. Thought content normal.  Vitals reviewed.    Assessment & Plan:   1. Fatigue, unspecified type - CBC with Differential - Comprehensive metabolic panel - TSH - Begin Caltrate  2. Acute bilateral thoracic back pain - Suspect MSK etiology but will work up. - Sedimentation Rate - C-reactive protein - Lipase  3. Other acute back pain - Suspect MSK etiology but will work up. - Sedimentation Rate - C-reactive protein  4. Migraine without status migrainosus, not intractable, unspeicified migraine type - Begin cyclobenzaprine and naproxen  5. Screening for diabetes mellitus - HgB A1c 5.1% today     Meds ordered this encounter  Medications  . Calcium Carbonate-Vit D-Min (CALTRATE 600+D PLUS MINERALS) 600-800 MG-UNIT TABS    Sig: Take 2 tablets by mouth daily.    Dispense:  60 tablet    Refill:  5    Order Specific Question:   Supervising Provider    Answer:   Charlott Rakes [[id number]]  . cyclobenzaprine (FLEXERIL) 10 MG tablet    Sig: Take 1 tablet (10 mg total) by mouth 3 (three) times daily as needed for muscle spasms.    Dispense:  30 tablet    Refill:  0    Order Specific Question:   Supervising Provider    Answer:   Charlott Rakes [[id number]]  . naproxen (NAPROSYN) 500 MG tablet    Sig: Take 1 tablet (500 mg total) by mouth 2 (two) times daily with a meal.    Dispense:  30 tablet    Refill:  0    Order Specific Question:   Supervising Provider    Answer:   Charlott Rakes [[id number]]     Follow-up: Return in about 4 weeks (around 05/06/2018) for back pain.   Clent Demark PA

## 2018-04-08 NOTE — Progress Notes (Signed)
Pt states her headaches are localized on the left side and causes her eyes to hurt

## 2018-04-09 LAB — CBC WITH DIFFERENTIAL/PLATELET
BASOS: 0 %
Basophils Absolute: 0 10*3/uL (ref 0.0–0.2)
EOS (ABSOLUTE): 0.3 10*3/uL (ref 0.0–0.4)
EOS: 5 %
HEMATOCRIT: 33.4 % — AB (ref 34.0–46.6)
HEMOGLOBIN: 11.1 g/dL (ref 11.1–15.9)
Immature Grans (Abs): 0 10*3/uL (ref 0.0–0.1)
Immature Granulocytes: 0 %
LYMPHS ABS: 2.4 10*3/uL (ref 0.7–3.1)
Lymphs: 39 %
MCH: 28 pg (ref 26.6–33.0)
MCHC: 33.2 g/dL (ref 31.5–35.7)
MCV: 84 fL (ref 79–97)
MONOCYTES: 9 %
Monocytes Absolute: 0.5 10*3/uL (ref 0.1–0.9)
NEUTROS ABS: 2.8 10*3/uL (ref 1.4–7.0)
Neutrophils: 47 %
Platelets: 216 10*3/uL (ref 150–450)
RBC: 3.96 x10E6/uL (ref 3.77–5.28)
RDW: 15.1 % (ref 12.3–15.4)
WBC: 6 10*3/uL (ref 3.4–10.8)

## 2018-04-09 LAB — COMPREHENSIVE METABOLIC PANEL
A/G RATIO: 1.4 (ref 1.2–2.2)
ALBUMIN: 4.4 g/dL (ref 3.5–5.5)
ALK PHOS: 96 IU/L (ref 39–117)
ALT: 18 IU/L (ref 0–32)
AST: 18 IU/L (ref 0–40)
BUN / CREAT RATIO: 20 (ref 9–23)
BUN: 13 mg/dL (ref 6–24)
Bilirubin Total: 0.2 mg/dL (ref 0.0–1.2)
CO2: 26 mmol/L (ref 20–29)
CREATININE: 0.64 mg/dL (ref 0.57–1.00)
Calcium: 9.5 mg/dL (ref 8.7–10.2)
Chloride: 100 mmol/L (ref 96–106)
GFR calc Af Amer: 124 mL/min/{1.73_m2} (ref >59)
GFR calc non Af Amer: 107 mL/min/{1.73_m2} (ref >59)
GLOBULIN, TOTAL: 3.2 g/dL (ref 1.5–4.5)
Glucose: 77 mg/dL (ref 65–99)
Potassium: 3.5 mmol/L (ref 3.5–5.2)
SODIUM: 142 mmol/L (ref 134–144)
Total Protein: 7.6 g/dL (ref 6.0–8.5)

## 2018-04-09 LAB — LIPASE: Lipase: 19 U/L (ref 14–72)

## 2018-04-09 LAB — C-REACTIVE PROTEIN: CRP: 15 mg/L — ABNORMAL HIGH (ref 0–10)

## 2018-04-09 LAB — SEDIMENTATION RATE: Sed Rate: 25 mm/hr (ref 0–32)

## 2018-04-09 LAB — TSH: TSH: 1.3 u[IU]/mL (ref 0.450–4.500)

## 2018-04-10 ENCOUNTER — Telehealth (INDEPENDENT_AMBULATORY_CARE_PROVIDER_SITE_OTHER): Payer: Self-pay

## 2018-04-10 NOTE — Telephone Encounter (Signed)
Called patient but error message that call can not be completed at this time, please hang up and try calling again later. Will attempt to reach patient once more before mailing results. Nat Christen, CMA

## 2018-04-10 NOTE — Telephone Encounter (Signed)
-----   Message from Clent Demark, PA-C sent at 04/10/2018  1:08 PM EDT ----- Sign of minimal acute inflammation. Rest of labs normal.

## 2018-04-13 ENCOUNTER — Telehealth (INDEPENDENT_AMBULATORY_CARE_PROVIDER_SITE_OTHER): Payer: Self-pay

## 2018-04-13 ENCOUNTER — Encounter (INDEPENDENT_AMBULATORY_CARE_PROVIDER_SITE_OTHER): Payer: Self-pay

## 2018-04-13 NOTE — Telephone Encounter (Signed)
-----   Message from Clent Demark, PA-C sent at 04/10/2018  1:08 PM EDT ----- Sign of minimal acute inflammation. Rest of labs normal.

## 2018-04-13 NOTE — Telephone Encounter (Signed)
Called patient but error message that call can not be completed at this time, please hang up and try your call again later. Results have been mailed. Nat Christen, CMA

## 2018-05-06 ENCOUNTER — Ambulatory Visit (INDEPENDENT_AMBULATORY_CARE_PROVIDER_SITE_OTHER): Payer: Self-pay | Admitting: Physician Assistant

## 2018-05-08 ENCOUNTER — Other Ambulatory Visit: Payer: Self-pay | Admitting: Neurology

## 2018-05-08 ENCOUNTER — Other Ambulatory Visit: Payer: Self-pay | Admitting: Obstetrics & Gynecology

## 2018-05-08 DIAGNOSIS — N939 Abnormal uterine and vaginal bleeding, unspecified: Secondary | ICD-10-CM

## 2018-05-14 ENCOUNTER — Other Ambulatory Visit: Payer: Self-pay

## 2018-05-15 ENCOUNTER — Ambulatory Visit: Payer: Self-pay | Admitting: Neurology

## 2018-05-15 ENCOUNTER — Other Ambulatory Visit: Payer: Self-pay

## 2018-05-15 DIAGNOSIS — N939 Abnormal uterine and vaginal bleeding, unspecified: Secondary | ICD-10-CM

## 2018-05-18 ENCOUNTER — Telehealth: Payer: Self-pay

## 2018-05-18 MED ORDER — MEGESTROL ACETATE 40 MG PO TABS: 40.0000 mg | ORAL_TABLET | Freq: Two times a day (BID) | ORAL | 2 refills | Status: DC

## 2018-05-18 NOTE — Telephone Encounter (Signed)
-----   Message from Lavonia Drafts, MD sent at 05/10/2018 10:46 AM EST ----- Please call pt. She is requesting a refill on Megace. She has not seen [place] in >1 year.  She needs to be seen for an eval. May have 1 month of Megace until that time. Please explain to pt that she will need to be seen for any refills after this time.  Thx, clh-S

## 2018-05-18 NOTE — Telephone Encounter (Signed)
Tried to call pt to make her aware that Megace has been sent to [facility]. No answer, no VM, could not leave a message.

## 2018-05-19 NOTE — Telephone Encounter (Signed)
Called pt to inform her that Megace had been sent to the [facility] on [address] but the pt did not answer and after a couple of minutes of ringing, the message "we're sorry but your call could not be completed as dialed."  Letter sent.

## 2018-05-25 ENCOUNTER — Other Ambulatory Visit: Payer: Self-pay

## 2018-05-25 ENCOUNTER — Other Ambulatory Visit (INDEPENDENT_AMBULATORY_CARE_PROVIDER_SITE_OTHER): Payer: Self-pay | Admitting: Physician Assistant

## 2018-05-25 ENCOUNTER — Encounter (HOSPITAL_COMMUNITY): Payer: Self-pay

## 2018-05-25 ENCOUNTER — Telehealth (INDEPENDENT_AMBULATORY_CARE_PROVIDER_SITE_OTHER): Payer: Self-pay | Admitting: Physician Assistant

## 2018-05-25 ENCOUNTER — Inpatient Hospital Stay (HOSPITAL_COMMUNITY)
Admission: AD | Admit: 2018-05-25 | Discharge: 2018-05-25 | Disposition: A | Discharge status: Home / Self Care | Payer: Self-pay | Source: Ambulatory Visit | Attending: Obstetrics and Gynecology | Admitting: Obstetrics and Gynecology

## 2018-05-25 DIAGNOSIS — N939 Abnormal uterine and vaginal bleeding, unspecified: Secondary | ICD-10-CM | POA: Insufficient documentation

## 2018-05-25 DIAGNOSIS — R109 Unspecified abdominal pain: Secondary | ICD-10-CM | POA: Insufficient documentation

## 2018-05-25 LAB — COMPREHENSIVE METABOLIC PANEL
ALBUMIN: 4.1 g/dL (ref 3.5–5.0)
ALK PHOS: 87 U/L (ref 38–126)
ALT: 20 U/L (ref 0–44)
AST: 20 U/L (ref 15–41)
Anion gap: 7 (ref 5–15)
BILIRUBIN TOTAL: 0.3 mg/dL (ref 0.3–1.2)
BUN: 13 mg/dL (ref 6–20)
CALCIUM: 8.7 mg/dL — AB (ref 8.9–10.3)
CO2: 28 mmol/L (ref 22–32)
Chloride: 103 mmol/L (ref 98–111)
Creatinine, Ser: 0.73 mg/dL (ref 0.44–1.00)
GFR calc Af Amer: >60 mL/min (ref >60)
GLUCOSE: 96 mg/dL (ref 70–99)
Potassium: 3.6 mmol/L (ref 3.5–5.1)
Sodium: 138 mmol/L (ref 135–145)
TOTAL PROTEIN: 8 g/dL (ref 6.5–8.1)

## 2018-05-25 LAB — CBC WITH DIFFERENTIAL/PLATELET
BASOS PCT: 0 %
Basophils Absolute: 0 10*3/uL (ref 0.0–0.1)
Eosinophils Absolute: 0.3 10*3/uL (ref 0.0–0.5)
Eosinophils Relative: 6 %
HEMATOCRIT: 34.8 % — AB (ref 36.0–46.0)
HEMOGLOBIN: 11.3 g/dL — AB (ref 12.0–15.0)
Lymphocytes Relative: 44 %
Lymphs Abs: 2.6 10*3/uL (ref 0.7–4.0)
MCH: 28.4 pg (ref 26.0–34.0)
MCHC: 32.5 g/dL (ref 30.0–36.0)
MCV: 87.4 fL (ref 80.0–100.0)
MONOS PCT: 6 %
Monocytes Absolute: 0.3 10*3/uL (ref 0.1–1.0)
NEUTROS ABS: 2.6 10*3/uL (ref 1.7–7.7)
NEUTROS PCT: 44 %
Platelets: 214 10*3/uL (ref 150–400)
RBC: 3.98 MIL/uL (ref 3.87–5.11)
RDW: 14.1 % (ref 11.5–15.5)
WBC: 5.9 10*3/uL (ref 4.0–10.5)
nRBC: 0 % (ref 0.0–0.2)

## 2018-05-25 MED ORDER — GABAPENTIN 300 MG PO CAPS: 300.0000 mg | ORAL_CAPSULE | Freq: Three times a day (TID) | ORAL | 2 refills | Status: DC

## 2018-05-25 MED ORDER — KETOROLAC TROMETHAMINE 10 MG PO TABS
10.0000 mg | ORAL_TABLET | Freq: Once | ORAL | Status: AC
  Administered 2018-05-25: 10 mg via ORAL
  Filled 2018-05-25 (×2): qty 1

## 2018-05-25 NOTE — Telephone Encounter (Signed)
Patient called to request medication refill for   gabapentin (NEURONTIN) 300 MG capsule    Patient use [address], [place] - [place], [place] - [address]  Please advice [phone]  Thank you Emmit Pomfret

## 2018-05-25 NOTE — Telephone Encounter (Signed)
Gabapentin sent for a total of three months.

## 2018-05-25 NOTE — MAU Provider Note (Signed)
History     CSN: [id number]  Arrival date and time: 05/25/18 1418   First Provider Initiated Contact with Patient 05/25/18 1602      Chief Complaint  Patient presents with  . Abdominal Pain  . Vaginal Bleeding  . Headache  . leg cramps   HPI Yesenia Simmons is a 47 y.o. [id number] non-pregnant patient who presents to MAU initially requestign refills of her Megace. Patient verbalizes she has recurrent heavy vaginal bleeding "although its getting better now I'm out of pills and my doctor can't see me until February 27th".  She denies chest pain, dizziness, SOB.   Patient also c/o abdominal cramping. She verbalizes her "Stomache" hurts but she gestures across her lower abdomen.  Pertinent Gynecological History: Menses: patient denies period for the past 2-3 years Bleeding: dysfunctional uterine bleeding Contraception: none DES exposure: denies Blood transfusions: none Sexually transmitted diseases: no past history Previous GYN Procedures: N/A  Last mammogram: normal Date: 08/2014 Last pap: normal Date: 08/2016   Past Medical History:  Diagnosis Date  . Asthma   . CTS (carpal tunnel syndrome)   . Hypertension   . RBBB   . Sinus tachycardia     History reviewed. No pertinent surgical history.  Family History  Problem Relation Age of Onset  . Stroke Maternal Grandmother   . Hypertension Mother   . Asthma Mother   . Hypertension Brother   . Hypertension Brother   . Heart attack Father   . Cancer Neg Hx     Social History   Tobacco Use  . Smoking status: Never Smoker  . Smokeless tobacco: Never Used  Substance Use Topics  . Alcohol use: No  . Drug use: No    Allergies:  Allergies  Allergen Reactions  . Other Hives  . Eggs Or Egg-Derived Products Hives  . Fish Allergy Hives  . Iodine Hives    Medications Prior to Admission  Medication Sig Dispense Refill Last Dose  . albuterol (PROVENTIL HFA;VENTOLIN HFA) 108 (90 Base) MCG/ACT inhaler Inhale 1-2  puffs into the lungs every 6 (six) hours as needed for wheezing or shortness of breath. 1 Inhaler 5 Taking  . amLODipine (NORVASC) 10 MG tablet Take 1 tablet (10 mg total) by mouth daily. (Patient not taking: Reported on 04/08/2018) 90 tablet 3 Not Taking  . aspirin EC 81 MG tablet Take 1 tablet (81 mg total) by mouth daily. 90 tablet 3 Taking  . Calcium Carbonate-Vit D-Min (CALTRATE 600+D PLUS MINERALS) 600-800 MG-UNIT TABS Take 2 tablets by mouth daily. 60 tablet 5   . Cholecalciferol (VITAMIN D3 MAXIMUM STRENGTH PO) Take 1 tablet by mouth daily at 3 pm. (1600)   Taking  . cyclobenzaprine (FLEXERIL) 10 MG tablet Take 1 tablet (10 mg total) by mouth 3 (three) times daily as needed for muscle spasms. 30 tablet 0   . gabapentin (NEURONTIN) 300 MG capsule Take 1 capsule (300 mg total) by mouth 3 (three) times daily. 90 capsule 2   . losartan-hydrochlorothiazide (HYZAAR) 100-25 MG tablet Take 1 tablet by mouth every morning. 90 tablet 3 Taking  . megestrol (MEGACE) 40 MG tablet Take 1 tablet (40 mg total) by mouth 2 (two) times daily. Can increase to two tablets twice a day in the event of heavy bleeding 60 tablet 2   . montelukast (SINGULAIR) 10 MG tablet Take 10 mg by mouth daily at 3 pm. (1600)   Taking  . naproxen (NAPROSYN) 500 MG tablet Take 1 tablet (500 mg total)  by mouth 2 (two) times daily with a meal. 30 tablet 0     Review of Systems  Constitutional: Negative for chills, fatigue and fever.  Gastrointestinal: Positive for abdominal pain.  Genitourinary: Positive for vaginal bleeding.  Musculoskeletal: Negative for back pain.  Neurological: Negative for headaches.  All other systems reviewed and are negative.  Physical Exam   Blood pressure 128/69, pulse 73, temperature 98.4 F (36.9 C), temperature source Oral, resp. rate 18, weight 85.5 kg, last menstrual period 08/04/2016, SpO2 100 %.  Physical Exam  Nursing note and vitals reviewed. Constitutional: She is oriented to person,  place, and time. She appears well-developed and well-nourished.  Cardiovascular: Normal rate.  Respiratory: Effort normal.  GI: Soft. She exhibits no distension. There is no tenderness. There is no rebound and no guarding.  Genitourinary:  Genitourinary Comments: Scant bleeding during evaluation in MAU  Neurological: She is alert and oriented to person, place, and time.  Skin: Skin is warm and dry.  Psychiatric: She has a normal mood and affect. Her behavior is normal. Thought content normal.    MAU Course/MDM  --Records reviewed from 08/2016 to present for management of preexisting AUB --Patient sleeping in MAU lobby while waiting for triage --Patient sleeping prior to receiving Toradol, continues to c/o abdominal pain 7/10 --Patient unaware a prescription for Megace had been called to her pharmacy by [facility] clinic staff 11/18 --Patient had full workup at PCP Oct 2019  Patient Vitals for the past 24 hrs:  BP Temp Temp src Pulse Resp SpO2 Weight  05/25/18 1708 123/79 - - 67 - - -  05/25/18 1443 128/69 98.4 F (36.9 C) Oral 73 18 100 % 85.5 kg    Results for orders placed or performed during the hospital encounter of 05/25/18 (from the past 24 hour(s))  CBC with Differential/Platelet     Status: Abnormal (Preliminary result)   Collection Time: 05/25/18  4:28 PM  Result Value Ref Range   WBC 5.9 4.0 - 10.5 K/uL   RBC 3.98 3.87 - 5.11 MIL/uL   Hemoglobin 11.3 (L) 12.0 - 15.0 g/dL   HCT 34.8 (L) 36.0 - 46.0 %   MCV 87.4 80.0 - 100.0 fL   MCH 28.4 26.0 - 34.0 pg   MCHC 32.5 30.0 - 36.0 g/dL   RDW 14.1 11.5 - 15.5 %   Platelets 214 150 - 400 K/uL   nRBC 0.0 0.0 - 0.2 %   Neutrophils Relative % 44 %   Neutro Abs 2.6 1.7 - 7.7 K/uL   Lymphocytes Relative 44 %   Lymphs Abs 2.6 0.7 - 4.0 K/uL   Monocytes Relative 6 %   Monocytes Absolute 0.3 0.1 - 1.0 K/uL   Eosinophils Relative 6 %   Eosinophils Absolute 0.3 0.0 - 0.5 K/uL   Basophils Relative 0 %   Basophils Absolute 0.0 0.0 -  0.1 K/uL   Other PENDING %  Comprehensive metabolic panel     Status: Abnormal   Collection Time: 05/25/18  4:28 PM  Result Value Ref Range   Sodium 138 135 - 145 mmol/L   Potassium 3.6 3.5 - 5.1 mmol/L   Chloride 103 98 - 111 mmol/L   CO2 28 22 - 32 mmol/L   Glucose, Bld 96 70 - 99 mg/dL   BUN 13 6 - 20 mg/dL   Creatinine, Ser 0.73 0.44 - 1.00 mg/dL   Calcium 8.7 (L) 8.9 - 10.3 mg/dL   Total Protein 8.0 6.5 - 8.1 g/dL  Albumin 4.1 3.5 - 5.0 g/dL   AST 20 15 - 41 U/L   ALT 20 0 - 44 U/L   Alkaline Phosphatase 87 38 - 126 U/L   Total Bilirubin 0.3 0.3 - 1.2 mg/dL   GFR calc non Af Amer >60 >60 mL/min   GFR calc Af Amer >60 >60 mL/min   Anion gap 7 5 - 15     Assessment and Plan  --47 y.o. [id number] patient --AUB, hemodynamically stable --Patient to pick up prescription for Megace as ordered by [facility] 05/18/18 --Discharge home in stable condition  F/U: Patient states she has follow-up at [facility] 08/27/18  Darlina Rumpf, [place] 05/25/2018, 5:12 PM

## 2018-05-25 NOTE — Discharge Instructions (Signed)

## 2018-05-25 NOTE — MAU Note (Signed)
Urine sent to lab 

## 2018-05-25 NOTE — Telephone Encounter (Signed)
Refill request. Last seen 04/08/18. Last refill was July 2019

## 2018-05-25 NOTE — MAU Note (Signed)
Friday afternoon was feeling bad, cramping and bleeding.  Hadn't had a period in 2-3 yrs.  Has been bleeding all weekend.  Cramping in legs and abd.  Having headache. Took some advil yesterday, little relief.

## 2018-06-11 ENCOUNTER — Ambulatory Visit (INDEPENDENT_AMBULATORY_CARE_PROVIDER_SITE_OTHER): Payer: Self-pay | Admitting: Physician Assistant

## 2018-07-13 ENCOUNTER — Other Ambulatory Visit: Payer: Self-pay | Admitting: Internal Medicine

## 2018-07-13 DIAGNOSIS — Z760 Encounter for issue of repeat prescription: Secondary | ICD-10-CM

## 2018-07-13 MED ORDER — AMLODIPINE BESYLATE 10 MG PO TABS: 10.0000 mg | ORAL_TABLET | Freq: Every day | ORAL | 1 refills | Status: DC

## 2018-07-13 MED ORDER — LOSARTAN POTASSIUM-HCTZ 100-25 MG PO TABS: 1.0000 | ORAL_TABLET | ORAL | 0 refills | Status: DC

## 2018-07-23 ENCOUNTER — Ambulatory Visit (INDEPENDENT_AMBULATORY_CARE_PROVIDER_SITE_OTHER): Payer: Self-pay | Admitting: Critical Care Medicine

## 2018-07-23 NOTE — Progress Notes (Deleted)
   Subjective:    Patient ID: Yesenia Simmons, female    DOB: [birth year], 48 y.o.   MRN: [id number]  47 y.o.F here for med refills  Last OV 03/2018  Yesenia Richters Thurmanis a 48 y.o.femalewith a PMH of asthma, CTS, HTN, RBBB, and sinus tachycardia presents with 3 -4 week hx of left frontal headache but also felt "all over" the head. Associated with photophobia and nausea. Occurs 2 -3 times per week. Has taken ibuprofen without relief. Rest in a dark place relieves the pain.     Has upper back and lower back pain since approximately a week and a half ago. Pain waxes and wanes. Does not endorse urinary incontinence, fecal incontinence, saddle paresthesia, paralysis, weakness, nuchal rigidity, or f/c/n/v.     Right hand pain attributed to CTS. Previous progress note Medicaid has not been approved and thus has not been scheduled for surgery. Does not know about CAFA.    Fatigue, unspecified type - CBC with Differential - Comprehensive metabolic panel - TSH - Begin Caltrate  2. Acute bilateral thoracic back pain - Suspect MSK etiology but will work up. - Sedimentation Rate - C-reactive protein - Lipase  3. Other acute back pain - Suspect MSK etiology but will work up. - Sedimentation Rate - C-reactive protein  4. Migraine without status migrainosus, not intractable, unspeicified migraine type - Begin cyclobenzaprine and naproxen  5. Screening for diabetes mellitus - HgB A1c 5.1% today          Review of Systems     Objective:   Physical Exam        Assessment & Plan:

## 2018-08-23 NOTE — Progress Notes (Deleted)
Follow-up Visit   Date: 08/23/18   Yesenia Simmons MRN: [id number] DOB: 22-Oct-1970   Interim History: Yesenia Simmons is a 48 y.o. right-handed African American female with asthma, hypertension, and sinus tachycardia returning to the clinic for follow-up of ***.  The patient was accompanied to the clinic by *** who also provides collateral information.    History of present illness: Starting in early summer 2018, she began having tingling over the thumbs and index finger which is constant and worse at night.  She has tried using an ice pack and running it under hot water which sometimes helps.  She complains of hand weakness and states that her grip is poor. Brushing her teeth in the morning is difficult, because she often drops the toothbrush.  She has been using wrist splints which does not help.  She was told she had CTS in 2006 based on clinical exam and did not have further investigations as her symptoms slowly improved. She has tried gabapentin 300mg  at bedtime which does not provide any relief.  She was also given oral steroids with no relief.  She denies any neck pain.    UPDATE ***:  Medications:  Current Outpatient Medications on File Prior to Visit  Medication Sig Dispense Refill  . albuterol (PROVENTIL HFA;VENTOLIN HFA) 108 (90 Base) MCG/ACT inhaler Inhale 1-2 puffs into the lungs every 6 (six) hours as needed for wheezing or shortness of breath. 1 Inhaler 5  . amLODipine (NORVASC) 10 MG tablet Take 1 tablet (10 mg total) by mouth daily. 90 tablet 1  . aspirin EC 81 MG tablet Take 1 tablet (81 mg total) by mouth daily. 90 tablet 3  . Calcium Carbonate-Vit D-Min (CALTRATE 600+D PLUS MINERALS) 600-800 MG-UNIT TABS Take 2 tablets by mouth daily. 60 tablet 5  . Cholecalciferol (VITAMIN D3 MAXIMUM STRENGTH PO) Take 1 tablet by mouth daily at 3 pm. (1600)    . cyclobenzaprine (FLEXERIL) 10 MG tablet Take 1 tablet (10 mg total) by mouth 3 (three) times daily as needed for  muscle spasms. 30 tablet 0  . gabapentin (NEURONTIN) 300 MG capsule Take 1 capsule (300 mg total) by mouth 3 (three) times daily. 90 capsule 2  . losartan-hydrochlorothiazide (HYZAAR) 100-25 MG tablet Take 1 tablet by mouth every morning. 90 tablet 0  . megestrol (MEGACE) 40 MG tablet Take 1 tablet (40 mg total) by mouth 2 (two) times daily. Can increase to two tablets twice a day in the event of heavy bleeding 60 tablet 2  . montelukast (SINGULAIR) 10 MG tablet Take 10 mg by mouth daily at 3 pm. (1600)    . naproxen (NAPROSYN) 500 MG tablet Take 1 tablet (500 mg total) by mouth 2 (two) times daily with a meal. 30 tablet 0   No current facility-administered medications on file prior to visit.     Allergies:  Allergies  Allergen Reactions  . Other Hives  . Eggs Or Egg-Derived Products Hives  . Fish Allergy Hives  . Iodine Hives    Review of Systems:  CONSTITUTIONAL: No fevers, chills, night sweats, or weight loss.  EYES: No visual changes or eye pain ENT: No hearing changes.  No history of nose bleeds.   RESPIRATORY: No cough, wheezing and shortness of breath.   CARDIOVASCULAR: Negative for chest pain, and palpitations.   GI: Negative for abdominal discomfort, blood in stools or black stools.  No recent change in bowel habits.   GU:  No history of incontinence.  MUSCLOSKELETAL: No history of joint pain or swelling.  No myalgias.   SKIN: Negative for lesions, rash, and itching.   ENDOCRINE: Negative for cold or heat intolerance, polydipsia or goiter.   PSYCH:  *** depression or anxiety symptoms.   NEURO: As Above.   Vital Signs:  LMP 08/04/2016 Comment: patient states bleeding is "spotting"   General Medical Exam:   General:  Well appearing, comfortable  Eyes/ENT: see cranial nerve examination.   Neck:  No carotid bruits. Respiratory:  Clear to auscultation, good air entry bilaterally.   Cardiac:  Regular rate and rhythm, no murmur.   Ext:  No edema ***  Neurological  Exam: MENTAL STATUS including orientation to time, place, person, recent and remote memory, attention span and concentration, language, and fund of knowledge is ***normal.  Speech is not dysarthric.  CRANIAL NERVES:  No visual field defects.  Pupils equal round and reactive to light.  Normal conjugate, extra-ocular eye movements in all directions of gaze.  No ptosis ***.  Face is symmetric. Palate elevates symmetrically.  Tongue is midline.  MOTOR:  Motor strength is 5/5 in all extremities, ***.  No atrophy, fasciculations or abnormal movements.  No pronator drift.  Tone is normal.    MSRs:  Reflexes are 2+/4 throughout ***.  SENSORY:  Intact to vibration throughout ***.  COORDINATION/GAIT:  Normal finger-to- nose-finger.  Intact rapid alternating movements bilaterally.  Gait narrow based and stable.   Data:*** NCS/EMG of the arms 02/28/2017: 1. Right median neuropathy at or distal to the wrist, consistent with the clinical diagnosis of carpal tunnel syndrome; moderate-to-severe in degree electrically. 2. Left median neuropathy at or distal to the wrist, consistent with clinical diagnosis of carpal tunnel syndrome; mild in degree electrically. 3. Left ulnar neuropathy with slowing across the elbow, purely demyelinating in type.  IMPRESSION/PLAN: ***  PLAN/RECOMMENDATIONS:  ***  Return to clinic in ***.    Thank you for allowing me to participate in patient's care.  If I can answer any additional questions, I would be pleased to do so.    Sincerely,    Brylie Sneath K. Posey Pronto, DO

## 2018-08-24 ENCOUNTER — Other Ambulatory Visit: Payer: Self-pay

## 2018-08-24 ENCOUNTER — Encounter (INDEPENDENT_AMBULATORY_CARE_PROVIDER_SITE_OTHER): Payer: Self-pay | Admitting: Primary Care

## 2018-08-24 ENCOUNTER — Ambulatory Visit (INDEPENDENT_AMBULATORY_CARE_PROVIDER_SITE_OTHER): Payer: Self-pay | Admitting: Primary Care

## 2018-08-24 VITALS — BP 116/69 | HR 64 | Temp 97.9°F | Ht 61.0 in | Wt 183.0 lb

## 2018-08-24 DIAGNOSIS — G43909 Migraine, unspecified, not intractable, without status migrainosus: Secondary | ICD-10-CM

## 2018-08-24 DIAGNOSIS — J45909 Unspecified asthma, uncomplicated: Secondary | ICD-10-CM

## 2018-08-24 DIAGNOSIS — Z760 Encounter for issue of repeat prescription: Secondary | ICD-10-CM

## 2018-08-24 DIAGNOSIS — G5603 Carpal tunnel syndrome, bilateral upper limbs: Secondary | ICD-10-CM

## 2018-08-24 DIAGNOSIS — Z23 Encounter for immunization: Secondary | ICD-10-CM

## 2018-08-24 DIAGNOSIS — R109 Unspecified abdominal pain: Secondary | ICD-10-CM

## 2018-08-24 LAB — POCT URINALYSIS DIPSTICK
Bilirubin, UA: NEGATIVE
GLUCOSE UA: NEGATIVE
KETONES UA: NEGATIVE
LEUKOCYTES UA: NEGATIVE
Nitrite, UA: NEGATIVE
Protein, UA: POSITIVE — AB
RBC UA: NEGATIVE
SPEC GRAV UA: 1.025 (ref 1.010–1.025)
Urobilinogen, UA: 1 E.U./dL
pH, UA: 6 (ref 5.0–8.0)

## 2018-08-24 MED ORDER — GABAPENTIN 300 MG PO CAPS: 300.0000 mg | ORAL_CAPSULE | Freq: Three times a day (TID) | ORAL | 2 refills | Status: DC

## 2018-08-24 MED ORDER — ALBUTEROL SULFATE HFA 108 (90 BASE) MCG/ACT IN AERS: 1.0000 | INHALATION_SPRAY | Freq: Four times a day (QID) | RESPIRATORY_TRACT | 5 refills | Status: DC | PRN

## 2018-08-24 MED ORDER — LOSARTAN POTASSIUM-HCTZ 100-25 MG PO TABS: 1.0000 | ORAL_TABLET | ORAL | 0 refills | Status: DC

## 2018-08-24 MED ORDER — CALTRATE 600+D PLUS MINERALS 600-800 MG-UNIT PO TABS: 2.0000 | ORAL_TABLET | Freq: Every day | ORAL | 5 refills | Status: DC

## 2018-08-24 MED ORDER — AMLODIPINE BESYLATE 10 MG PO TABS: 10.0000 mg | ORAL_TABLET | Freq: Every day | ORAL | 1 refills | Status: DC

## 2018-08-24 MED ORDER — MONTELUKAST SODIUM 10 MG PO TABS: 10.0000 mg | ORAL_TABLET | Freq: Every day | ORAL | 1 refills | Status: DC

## 2018-08-24 MED ORDER — BUTALBITAL-ASPIRIN-CAFFEINE 50-325-40 MG PO CAPS: 1.0000 | ORAL_CAPSULE | Freq: Four times a day (QID) | ORAL | 0 refills | Status: DC | PRN

## 2018-08-24 NOTE — Patient Instructions (Signed)

## 2018-08-24 NOTE — Progress Notes (Signed)
Established Patient Office Visit  Subjective:  Patient ID: Yesenia Simmons, female    DOB: 1971-02-03  Age: 48 y.o. MRN: [id number]  CC:  Chief Complaint  Patient presents with  . Referral    neurology    HPI ELLAR HAKALA presents for conservative measures for carpal tunnel this will include physical therapy NSAIDS and braces if needed.   She has a PMH of asthma, CTS, HTN, RBBB, and sinus tachycardia presents and  frontal headache. Her upper back and lower back has improved with less bending and standing for long periods of time.     Past Medical History:  Diagnosis Date  . Asthma   . CTS (carpal tunnel syndrome)   . Hypertension   . RBBB   . Sinus tachycardia     History reviewed. No pertinent surgical history.  Family History  Problem Relation Age of Onset  . Stroke Maternal Grandmother   . Hypertension Mother   . Asthma Mother   . Hypertension Brother   . Hypertension Brother   . Heart attack Father   . Cancer Neg Hx     Social History   Socioeconomic History  . Marital status: Single    Spouse name: Not on file  . Number of children: 3  . Years of education: 28  . Highest education level: Not on file  Occupational History  . Occupation: in between jobs  Social Needs  . Financial resource strain: Not on file  . Food insecurity:    Worry: Not on file    Inability: Not on file  . Transportation needs:    Medical: Not on file    Non-medical: Not on file  Tobacco Use  . Smoking status: Never Smoker  . Smokeless tobacco: Never Used  Substance and Sexual Activity  . Alcohol use: No  . Drug use: No  . Sexual activity: Yes    Birth control/protection: Surgical  Lifestyle  . Physical activity:    Days per week: Not on file    Minutes per session: Not on file  . Stress: Not on file  Relationships  . Social connections:    Talks on phone: Not on file    Gets together: Not on file    Attends religious service: Not on file    Active member of  club or organization: Not on file    Attends meetings of clubs or organizations: Not on file    Relationship status: Not on file  . Intimate partner violence:    Fear of current or ex partner: Not on file    Emotionally abused: Not on file    Physically abused: Not on file    Forced sexual activity: Not on file  Other Topics Concern  . Not on file  Social History Narrative   Lives with son in a 2 story home.  Has 3 children.  Currently in between jobs.  Education: high school.      Outpatient Medications Prior to Visit  Medication Sig Dispense Refill  . megestrol (MEGACE) 40 MG tablet Take 1 tablet (40 mg total) by mouth 2 (two) times daily. Can increase to two tablets twice a day in the event of heavy bleeding 60 tablet 2  . amLODipine (NORVASC) 10 MG tablet Take 1 tablet (10 mg total) by mouth daily. 90 tablet 1  . gabapentin (NEURONTIN) 300 MG capsule Take 1 capsule (300 mg total) by mouth 3 (three) times daily. 90 capsule 2  . losartan-hydrochlorothiazide (HYZAAR)  100-25 MG tablet Take 1 tablet by mouth every morning. 90 tablet 0  . montelukast (SINGULAIR) 10 MG tablet Take 10 mg by mouth daily at 3 pm. (1600)    . aspirin EC 81 MG tablet Take 1 tablet (81 mg total) by mouth daily. 90 tablet 3  . Cholecalciferol (VITAMIN D3 MAXIMUM STRENGTH PO) Take 1 tablet by mouth daily at 3 pm. (1600)    . albuterol (PROVENTIL HFA;VENTOLIN HFA) 108 (90 Base) MCG/ACT inhaler Inhale 1-2 puffs into the lungs every 6 (six) hours as needed for wheezing or shortness of breath. 1 Inhaler 5  . Calcium Carbonate-Vit D-Min (CALTRATE 600+D PLUS MINERALS) 600-800 MG-UNIT TABS Take 2 tablets by mouth daily. 60 tablet 5  . cyclobenzaprine (FLEXERIL) 10 MG tablet Take 1 tablet (10 mg total) by mouth 3 (three) times daily as needed for muscle spasms. 30 tablet 0  . naproxen (NAPROSYN) 500 MG tablet Take 1 tablet (500 mg total) by mouth 2 (two) times daily with a meal. 30 tablet 0   No facility-administered  medications prior to visit.     Allergies  Allergen Reactions  . Other Hives  . Eggs Or Egg-Derived Products Hives  . Fish Allergy Hives  . Iodine Hives    ROS Review of Systems  Constitutional: Negative.   HENT: Negative.   Eyes: Negative.   Respiratory: Negative.   Gastrointestinal: Negative.   Endocrine: Negative.   Genitourinary: Negative.   Musculoskeletal: Positive for arthralgias.  Skin: Negative.   Allergic/Immunologic: Negative.   Neurological: Positive for weakness.  Hematological: Negative.   Psychiatric/Behavioral: Negative.       Objective:    Physical Exam  Constitutional: She is oriented to person, place, and time. She appears well-developed and well-nourished.  HENT:  Head: Normocephalic.  Eyes: Pupils are equal, round, and reactive to light.  Neck: Normal range of motion. Neck supple.  Cardiovascular: Normal rate.  Pulmonary/Chest: Effort normal and breath sounds normal.  Abdominal: Soft. Bowel sounds are normal.  Musculoskeletal: Normal range of motion.  Neurological: She is alert and oriented to person, place, and time.  Skin: Skin is warm and dry.  Psychiatric: She has a normal mood and affect.    BP 116/69 (BP Location: Right Arm, Patient Position: Sitting, Cuff Size: Large)   Pulse 64   Temp 97.9 F (36.6 C) (Oral)   Ht 5\' 1"  (1.549 m)   Wt 183 lb (83 kg)   LMP 08/04/2016 Comment: patient states bleeding is "spotting"  SpO2 100%   BMI 34.58 kg/m  Wt Readings from Last 3 Encounters:  08/24/18 183 lb (83 kg)  05/25/18 188 lb 8 oz (85.5 kg)  04/08/18 187 lb 12.8 oz (85.2 kg)     Health Maintenance Due  Topic Date Due  . TETANUS/TDAP  12/02/1990    There are no preventive care reminders to display for this patient.  Lab Results  Component Value Date   TSH 1.300 04/08/2018       Assessment & Plan:  [name] was seen today for referral.  Diagnoses and all orders for this visit:  Need for Tdap vaccination -     Tdap  vaccine greater than or equal to 7yo IM  Abdominal pain, unspecified abdominal location -     Urinalysis Dipstick  Bilateral carpal tunnel syndrome -     Ambulatory referral to Physical Therapy  Migraine without status migrainosus, not intractable, unspecified migraine type -     butalbital-aspirin-caffeine (FIORINAL) 50-325-40 MG capsule; Take 1  capsule by mouth every 6 (six) hours as needed for headache.  Mild asthma, unspecified whether complicated, unspecified whether persistent -     albuterol (PROVENTIL HFA;VENTOLIN HFA) 108 (90 Base) MCG/ACT inhaler; Inhale 1-2 puffs into the lungs every 6 (six) hours as needed for wheezing or shortness of breath.  Medication refill -     amLODipine (NORVASC) 10 MG tablet; Take 1 tablet (10 mg total) by mouth daily. -     losartan-hydrochlorothiazide (HYZAAR) 100-25 MG tablet; Take 1 tablet by mouth every morning.  Other orders -     Calcium Carbonate-Vit D-Min (CALTRATE 600+D PLUS MINERALS) 600-800 MG-UNIT TABS; Take 2 tablets by mouth daily. -     gabapentin (NEURONTIN) 300 MG capsule; Take 1 capsule (300 mg total) by mouth 3 (three) times daily. -     montelukast (SINGULAIR) 10 MG tablet; Take 1 tablet (10 mg total) by mouth daily at 3 pm. (1600)  Diagnoses and all orders for this visit:  Need for Tdap vaccination -     Tdap vaccine greater than or equal to 7yo IM  Abdominal pain, unspecified abdominal location -     Urinalysis Dipstick   Follow-up: Return in about 3 months (around 11/22/2018) for first available appt for pap.    Kerin Perna, NP

## 2018-08-26 ENCOUNTER — Ambulatory Visit: Payer: Self-pay | Admitting: Neurology

## 2018-08-26 ENCOUNTER — Encounter

## 2018-08-27 ENCOUNTER — Telehealth (INDEPENDENT_AMBULATORY_CARE_PROVIDER_SITE_OTHER): Payer: Self-pay | Admitting: Primary Care

## 2018-08-27 NOTE — Telephone Encounter (Signed)
[facility] contact us to informal that [employer] will not cover   butalbital-aspirin-caffeine [facility]) 50-325-40 MG capsule   It will cost the patient $75 But will cover the generic version which is   Fioriset and it is a Tablet it is the same dosage and will cost $7.55  Please send Rx to [facility]   [phone]  Thank you Emmit Pomfret

## 2018-08-27 NOTE — Telephone Encounter (Signed)
FWD to PCP. Tempestt S Roberts, CMA  

## 2018-08-27 NOTE — Telephone Encounter (Signed)
Spoke with pharmacist VO given

## 2018-09-21 ENCOUNTER — Ambulatory Visit (INDEPENDENT_AMBULATORY_CARE_PROVIDER_SITE_OTHER): Payer: Self-pay | Admitting: Primary Care

## 2018-09-28 ENCOUNTER — Ambulatory Visit (INDEPENDENT_AMBULATORY_CARE_PROVIDER_SITE_OTHER): Payer: Self-pay | Admitting: Primary Care

## 2018-09-30 ENCOUNTER — Ambulatory Visit (INDEPENDENT_AMBULATORY_CARE_PROVIDER_SITE_OTHER): Payer: Self-pay | Admitting: Primary Care

## 2018-10-07 ENCOUNTER — Ambulatory Visit (INDEPENDENT_AMBULATORY_CARE_PROVIDER_SITE_OTHER): Payer: Self-pay | Admitting: Obstetrics & Gynecology

## 2018-10-07 ENCOUNTER — Other Ambulatory Visit: Payer: Self-pay

## 2018-10-07 ENCOUNTER — Encounter: Payer: Self-pay | Admitting: Obstetrics & Gynecology

## 2018-10-07 DIAGNOSIS — N939 Abnormal uterine and vaginal bleeding, unspecified: Secondary | ICD-10-CM

## 2018-10-07 DIAGNOSIS — B9689 Other specified bacterial agents as the cause of diseases classified elsewhere: Secondary | ICD-10-CM

## 2018-10-07 DIAGNOSIS — N760 Acute vaginitis: Secondary | ICD-10-CM

## 2018-10-07 MED ORDER — METRONIDAZOLE 500 MG PO TABS: 500.0000 mg | ORAL_TABLET | Freq: Two times a day (BID) | ORAL | 0 refills | Status: DC

## 2018-10-07 MED ORDER — MEGESTROL ACETATE 40 MG PO TABS: 40.0000 mg | ORAL_TABLET | Freq: Two times a day (BID) | ORAL | 2 refills | Status: DC

## 2018-10-07 NOTE — Telephone Encounter (Signed)
Received Rx refill request from [facility] for Megestrol 40 mg Tablet.

## 2018-10-07 NOTE — Progress Notes (Signed)
Pt states that she has been having abdominal cramping, clear discharge, and bleeding with odor x 2 weeks.

## 2018-10-07 NOTE — Progress Notes (Addendum)
   TELEHEALTH VIRTUAL GYNECOLOGY VISIT ENCOUNTER NOTE  I connected with Yesenia Simmons on 10/07/18 at  3:00 PM EDT by telephone at home and verified that I am speaking with the correct person using two identifiers.   I discussed the limitations, risks, security and privacy concerns of performing an evaluation and management service by telephone and the availability of in person appointments. I also discussed with the patient that there may be a patient responsible charge related to this service. The patient expressed understanding and agreed to proceed.   History:  Yesenia Simmons is a 48 y.o. [id number] female being evaluated today for discharge, cramping and bleeding. Pt reports 2 weeks of bleeding. Pt is on Megace 40mg  daily. The discharge had an odor and is running like she is wet all the time. She took on OTC wash but, it did not help. The discharge is concerning her most of all. No new sexual contact. Pt reports that she is not worried about STIs.   Pt last had bleeding in Dec 2019. She denies any abnormal vaginal discharge, bleeding, pelvic pain or other concerns.       Past Medical History:  Diagnosis Date  . Asthma   . CTS (carpal tunnel syndrome)   . Hypertension   . RBBB   . Sinus tachycardia    The following portions of the patient's history were reviewed and updated as appropriate: allergies, current medications, past family history, past medical history, past social history, past surgical history and problem list.   Health Maintenance:  09/02/2016 Adequacy Satisfactory for evaluation endocervical/transformation zone component PRESENT.   Diagnosis NEGATIVE FOR INTRAEPITHELIAL LESIONS OR MALIGNANCY.   HPV NOT DETECTED   Comment: Normal Reference Range - NOT Detected  Chlamydia Negative   Comment: Normal Reference Range - Negative  Neisseria gonorrhea Negative   Comment: Normal Reference Range - Negative  Bacterial vaginitis POSITIVE for Gardnerella vaginalisAbnormal     Comment: Normal Reference Range - Negative  Candida vaginitis Negative for Candida species   Comment: Normal Reference Range - Negative  Material Submitted CervicoVaginal Pap [ThinPrep Imaged]       Review of Systems:  Pertinent items noted in HPI and remainder of comprehensive ROS otherwise negative.  Physical Exam:   General:  Alert, oriented and cooperative.   Mental Status: Normal mood and affect perceived. Normal judgment and thought content.  Physical exam deferred due to nature of the encounter  Labs and Imaging No results found for this or any previous visit (from the past 336 hour(s)). No results found.    Assessment and Plan:     BV  - Flagyl 500mg  bid x7  AUB- Keep Megace 40mg  daily.     I discussed the assessment and treatment plan with the patient. The patient was provided an opportunity to ask questions and all were answered. The patient agreed with the plan and demonstrated an understanding of the instructions.   The patient was advised to call back or seek an in-person evaluation/go to the ED if the symptoms worsen or if the condition fails to improve as anticipated.  I provided [phone]  10 minutes of non-face-to-face time during this encounter.   Lavonia Drafts, MD [facility], [place]

## 2018-11-24 ENCOUNTER — Encounter: Payer: Self-pay | Admitting: Primary Care

## 2018-11-24 ENCOUNTER — Ambulatory Visit (INDEPENDENT_AMBULATORY_CARE_PROVIDER_SITE_OTHER): Payer: Self-pay | Admitting: Primary Care

## 2018-11-24 ENCOUNTER — Ambulatory Visit: Payer: Self-pay | Attending: Primary Care | Admitting: Primary Care

## 2018-11-24 ENCOUNTER — Ambulatory Visit: Payer: Self-pay

## 2018-11-24 ENCOUNTER — Other Ambulatory Visit: Payer: Self-pay

## 2018-11-24 VITALS — BP 109/79 | HR 79 | Temp 98.7°F

## 2018-11-24 DIAGNOSIS — Z760 Encounter for issue of repeat prescription: Secondary | ICD-10-CM

## 2018-11-24 DIAGNOSIS — D508 Other iron deficiency anemias: Secondary | ICD-10-CM

## 2018-11-24 DIAGNOSIS — I10 Essential (primary) hypertension: Secondary | ICD-10-CM

## 2018-11-24 DIAGNOSIS — E669 Obesity, unspecified: Secondary | ICD-10-CM

## 2018-11-24 DIAGNOSIS — Z20828 Contact with and (suspected) exposure to other viral communicable diseases: Secondary | ICD-10-CM

## 2018-11-24 DIAGNOSIS — Z20822 Contact with and (suspected) exposure to covid-19: Secondary | ICD-10-CM

## 2018-11-24 MED ORDER — LOSARTAN POTASSIUM-HCTZ 100-25 MG PO TABS: 1.0000 | ORAL_TABLET | ORAL | 0 refills | Status: DC

## 2018-11-24 MED ORDER — AMLODIPINE BESYLATE 10 MG PO TABS: 10.0000 mg | ORAL_TABLET | Freq: Every day | ORAL | 1 refills | Status: DC

## 2018-11-24 MED ORDER — GABAPENTIN 300 MG PO CAPS: 300.0000 mg | ORAL_CAPSULE | Freq: Three times a day (TID) | ORAL | 2 refills | Status: DC

## 2018-11-24 NOTE — Progress Notes (Signed)
Patient verified DOB Patient has not taken medication. Patient has not eaten today. Patient denies pain at this time. Patient states her friend was positive for COVID

## 2018-11-24 NOTE — Progress Notes (Signed)
Virtual Visit via Telephone Note  I connected with Yesenia Simmons on 11/24/18 at  9:25 AM EDT by telephone and verified that I am speaking with the correct person using two identifiers.   I discussed the limitations, risks, security and privacy concerns of performing an evaluation and management service by telephone and the availability of in person appointments. I also discussed with the patient that there may be a patient responsible charge related to this service. The patient expressed understanding and agreed to proceed.   History of Present Illness: Yesenia Simmons was ordinally schedule for a pap but it was cancel she was tx via tele for BV and symptom resolved . Patient states she has been in contact with a COVID+. The went to the beach no close contact. Denies any s/s.   Observations/Objective: Review of Systems  Constitutional: Negative.   HENT: Negative.   Eyes: Negative.   Respiratory: Negative.   Cardiovascular: Negative.   Gastrointestinal: Negative.   Genitourinary: Negative.   Musculoskeletal: Negative.   Skin: Negative.    Assessment and Plan: Diagnoses and all orders for this visit:  Other iron deficiency anemia 11/19 low H/H probable of chronic disease will ck ferritin levels  -     CBC with Differential/Platelet; Future -     CMP14+Fe+CBC/D/Plt+TIBC+Fer...  Medication refill -     amLODipine (NORVASC) 10 MG tablet; Take 1 tablet (10 mg total) by mouth daily. -     losartan-hydrochlorothiazide (HYZAAR) 100-25 MG tablet; Take 1 tablet by mouth every morning.  Essential hypertension Bp controlledShe is able to take Bp at home stated  but was not able to read me off ranges  -     CMP14+Fe+CBC/D/Plt+TIBC+Fer...  Close Exposure to Covid-19 Virus Friend told her that she was positive they used 6 feet rule and mask d/w s/sof COVID and to call if anything changed  Obesity (BMI 30.0-34.9) Diet and exercise decrease carbs  -     Lipid panel; Future  Other orders -      gabapentin (NEURONTIN) 300 MG capsule; Take 1 capsule (300 mg total) by mouth 3 (three) times daily.    Follow Up Instructions:    I discussed the assessment and treatment plan with the patient. The patient was provided an opportunity to ask questions and all were answered. The patient agreed with the plan and demonstrated an understanding of the instructions.   The patient was advised to call back or seek an in-person evaluation if the symptoms worsen or if the condition fails to improve as anticipated.  I provided 18 minutes of non-face-to-face time during this encounter.   Kerin Perna, NP

## 2018-11-25 LAB — LIPID PANEL
Chol/HDL Ratio: 3.6 ratio (ref 0.0–4.4)
Cholesterol, Total: 161 mg/dL (ref 100–199)
HDL: 45 mg/dL (ref >39)
LDL Calculated: 105 mg/dL — ABNORMAL HIGH (ref 0–99)
Triglycerides: 56 mg/dL (ref 0–149)
VLDL Cholesterol Cal: 11 mg/dL (ref 5–40)

## 2018-11-27 LAB — CMP14+FE+CBC/D/PLT+TIBC+FER...
ALT: 18 IU/L (ref 0–32)
AST: 15 IU/L (ref 0–40)
Albumin/Globulin Ratio: 1.5 (ref 1.2–2.2)
Albumin: 4.4 g/dL (ref 3.8–4.8)
Alkaline Phosphatase: 87 IU/L (ref 39–117)
BUN/Creatinine Ratio: 20 (ref 9–23)
BUN: 15 mg/dL (ref 6–24)
Basophils Absolute: 0 10*3/uL (ref 0.0–0.2)
Basos: 0 %
Bilirubin Total: 0.4 mg/dL (ref 0.0–1.2)
CO2: 21 mmol/L (ref 20–29)
Calcium: 9.5 mg/dL (ref 8.7–10.2)
Chloride: 102 mmol/L (ref 96–106)
Coenzyme Q10, Total: 0.66 ug/mL (ref 0.37–2.20)
Creatinine, Ser: 0.75 mg/dL (ref 0.57–1.00)
EOS (ABSOLUTE): 0.2 10*3/uL (ref 0.0–0.4)
Eos: 3 %
Ferritin: 164 ng/mL — ABNORMAL HIGH (ref 15–150)
Folate: 11.3 ng/mL (ref >3.0)
GFR calc Af Amer: 111 mL/min/{1.73_m2} (ref >59)
GFR calc non Af Amer: 96 mL/min/{1.73_m2} (ref >59)
Globulin, Total: 3 g/dL (ref 1.5–4.5)
Glucose: 88 mg/dL (ref 65–99)
Hematocrit: 34.7 % (ref 34.0–46.6)
Hemoglobin: 11.3 g/dL (ref 11.1–15.9)
Homocysteine: 11 umol/L (ref 0.0–14.5)
Immature Grans (Abs): 0 10*3/uL (ref 0.0–0.1)
Immature Granulocytes: 0 %
Iron Saturation: 12 % — ABNORMAL LOW (ref 15–55)
Iron: 47 ug/dL (ref 27–159)
Lymphocytes Absolute: 2.4 10*3/uL (ref 0.7–3.1)
Lymphs: 40 %
MCH: 28.3 pg (ref 26.6–33.0)
MCHC: 32.6 g/dL (ref 31.5–35.7)
MCV: 87 fL (ref 79–97)
Monocytes Absolute: 0.5 10*3/uL (ref 0.1–0.9)
Monocytes: 8 %
Neutrophils Absolute: 3 10*3/uL (ref 1.4–7.0)
Neutrophils: 49 %
Platelets: 249 10*3/uL (ref 150–450)
Potassium: 4 mmol/L (ref 3.5–5.2)
RBC: 4 x10E6/uL (ref 3.77–5.28)
RDW: 14.1 % (ref 11.7–15.4)
Sodium: 139 mmol/L (ref 134–144)
Testosterone: 5 ng/dL — ABNORMAL LOW (ref 8–48)
Total Iron Binding Capacity: 383 ug/dL (ref 250–450)
Total Protein: 7.4 g/dL (ref 6.0–8.5)
Transferrin: 311 mg/dL (ref 192–364)
UIBC: 336 ug/dL (ref 131–425)
Vit D, 25-Hydroxy: 23.7 ng/mL — ABNORMAL LOW (ref 30.0–100.0)
Vitamin B-12: 427 pg/mL (ref 232–1245)
WBC: 6.1 10*3/uL (ref 3.4–10.8)
Zinc: 93 ug/dL (ref 56–134)

## 2018-11-29 ENCOUNTER — Other Ambulatory Visit: Payer: Self-pay | Admitting: Primary Care

## 2018-11-29 MED ORDER — VITAMIN D (ERGOCALCIFEROL) 1.25 MG (50000 UNIT) PO CAPS: 50000.0000 [IU] | ORAL_CAPSULE | ORAL | 0 refills | Status: AC

## 2018-11-30 ENCOUNTER — Telehealth: Payer: Self-pay | Admitting: Emergency Medicine

## 2018-11-30 NOTE — Telephone Encounter (Signed)
Patient contacted via phone to be given results of labs.  Patient identified by name and date of birth.  Patient given results of labs.  Patient educated on lab results. Questions answered. Patient acknowledged understanding of labs results. 

## 2018-12-14 ENCOUNTER — Ambulatory Visit (INDEPENDENT_AMBULATORY_CARE_PROVIDER_SITE_OTHER): Payer: Self-pay | Admitting: Primary Care

## 2019-03-29 ENCOUNTER — Other Ambulatory Visit: Payer: Self-pay

## 2019-03-29 DIAGNOSIS — N939 Abnormal uterine and vaginal bleeding, unspecified: Secondary | ICD-10-CM

## 2019-03-29 MED ORDER — MEGESTROL ACETATE 40 MG PO TABS: 40.0000 mg | ORAL_TABLET | Freq: Two times a day (BID) | ORAL | 2 refills | Status: DC

## 2019-05-19 ENCOUNTER — Ambulatory Visit (INDEPENDENT_AMBULATORY_CARE_PROVIDER_SITE_OTHER): Payer: Self-pay | Admitting: Primary Care

## 2019-05-19 ENCOUNTER — Encounter (INDEPENDENT_AMBULATORY_CARE_PROVIDER_SITE_OTHER): Payer: Self-pay | Admitting: Primary Care

## 2019-05-19 ENCOUNTER — Other Ambulatory Visit: Payer: Self-pay

## 2019-05-19 VITALS — BP 113/70 | HR 75 | Temp 95.7°F | Ht 61.0 in | Wt 191.4 lb

## 2019-05-19 DIAGNOSIS — G43909 Migraine, unspecified, not intractable, without status migrainosus: Secondary | ICD-10-CM

## 2019-05-19 DIAGNOSIS — Z760 Encounter for issue of repeat prescription: Secondary | ICD-10-CM

## 2019-05-19 DIAGNOSIS — J0110 Acute frontal sinusitis, unspecified: Secondary | ICD-10-CM

## 2019-05-19 DIAGNOSIS — E782 Mixed hyperlipidemia: Secondary | ICD-10-CM

## 2019-05-19 DIAGNOSIS — J4540 Moderate persistent asthma, uncomplicated: Secondary | ICD-10-CM

## 2019-05-19 DIAGNOSIS — I10 Essential (primary) hypertension: Secondary | ICD-10-CM

## 2019-05-19 DIAGNOSIS — J45909 Unspecified asthma, uncomplicated: Secondary | ICD-10-CM

## 2019-05-19 DIAGNOSIS — E559 Vitamin D deficiency, unspecified: Secondary | ICD-10-CM

## 2019-05-19 MED ORDER — AMLODIPINE BESYLATE 10 MG PO TABS: 10.0000 mg | ORAL_TABLET | Freq: Every day | ORAL | 1 refills | Status: DC

## 2019-05-19 MED ORDER — LOSARTAN POTASSIUM-HCTZ 100-25 MG PO TABS: 1.0000 | ORAL_TABLET | ORAL | 1 refills | Status: DC

## 2019-05-19 MED ORDER — BUTALBITAL-ASPIRIN-CAFFEINE 50-325-40 MG PO CAPS: 1.0000 | ORAL_CAPSULE | Freq: Four times a day (QID) | ORAL | 2 refills | Status: DC | PRN

## 2019-05-19 MED ORDER — AMOXICILLIN-POT CLAVULANATE 875-125 MG PO TABS: 1.0000 | ORAL_TABLET | Freq: Two times a day (BID) | ORAL | 0 refills | Status: DC

## 2019-05-19 MED ORDER — FLUCONAZOLE 150 MG PO TABS: 150.0000 mg | ORAL_TABLET | Freq: Once | ORAL | 0 refills | Status: AC

## 2019-05-19 MED ORDER — GABAPENTIN 300 MG PO CAPS: 300.0000 mg | ORAL_CAPSULE | Freq: Three times a day (TID) | ORAL | 1 refills | Status: DC

## 2019-05-19 MED ORDER — ALBUTEROL SULFATE HFA 108 (90 BASE) MCG/ACT IN AERS: 1.0000 | INHALATION_SPRAY | Freq: Four times a day (QID) | RESPIRATORY_TRACT | 1 refills | Status: DC | PRN

## 2019-05-19 MED ORDER — MONTELUKAST SODIUM 10 MG PO TABS: 10.0000 mg | ORAL_TABLET | Freq: Every day | ORAL | 1 refills | Status: DC

## 2019-05-19 MED ORDER — CALTRATE 600+D PLUS MINERALS 600-800 MG-UNIT PO TABS: 2.0000 | ORAL_TABLET | Freq: Every day | ORAL | 5 refills | Status: DC

## 2019-05-19 MED ORDER — BUDESONIDE-FORMOTEROL FUMARATE 160-4.5 MCG/ACT IN AERO: 2.0000 | INHALATION_SPRAY | Freq: Two times a day (BID) | RESPIRATORY_TRACT | 3 refills | Status: DC

## 2019-05-19 NOTE — Progress Notes (Signed)
Acute Office Visit  Subjective:    Patient ID: Yesenia Simmons, female    DOB: Apr 11, 1971, 48 y.o.   MRN: [id number]  Chief Complaint  Patient presents with  . Medication Refill    would like 90 day supply of prescriptions   . Asthma    HPI Patient is in today for an acute visit she has been having headaches for the the 3 -4 day pain is between eye and  back of neck on the right side. The headache wakes her up in the middle of the night and she is unable to go back to sleep. Takes 2 tylenol (366m) with some relief. She has also been having asthma exacerbation.   Past Medical History:  Diagnosis Date  . Asthma   . CTS (carpal tunnel syndrome)   . Hypertension   . RBBB   . Sinus tachycardia     History reviewed. No pertinent surgical history.  Family History  Problem Relation Age of Onset  . Stroke Maternal Grandmother   . Hypertension Mother   . Asthma Mother   . Hypertension Brother   . Hypertension Brother   . Heart attack Father   . Cancer Neg Hx     Social History   Socioeconomic History  . Marital status: Single    Spouse name: Not on file  . Number of children: 3  . Years of education: 187 . Highest education level: Not on file  Occupational History  . Occupation: in between jobs  Social Needs  . Financial resource strain: Not on file  . Food insecurity    Worry: Not on file    Inability: Not on file  . Transportation needs    Medical: Not on file    Non-medical: Not on file  Tobacco Use  . Smoking status: Never Smoker  . Smokeless tobacco: Never Used  Substance and Sexual Activity  . Alcohol use: No  . Drug use: No  . Sexual activity: Yes    Birth control/protection: Surgical  Lifestyle  . Physical activity    Days per week: Not on file    Minutes per session: Not on file  . Stress: Not on file  Relationships  . Social [place]: Not on file    Gets together: Not on file    Attends religious service: Not on file     Active member of club or organization: Not on file    Attends meetings of clubs or organizations: Not on file    Relationship status: Not on file  . Intimate partner violence    Fear of current or ex partner: Not on file    Emotionally abused: Not on file    Physically abused: Not on file    Forced sexual activity: Not on file  Other Topics Concern  . Not on file  Social History Narrative   Lives with son in a 2 story home.  Has 3 children.  Currently in between jobs.  Education: high school.      Outpatient Medications Prior to Visit  Medication Sig Dispense Refill  . aspirin EC 81 MG tablet Take 1 tablet (81 mg total) by mouth daily. (Patient not taking: Reported on 05/19/2019) 90 tablet 3  . Cholecalciferol (VITAMIN D3 MAXIMUM STRENGTH PO) Take 1 tablet by mouth daily at 3 pm. (1600)    . megestrol (MEGACE) 40 MG tablet Take 1 tablet (40 mg total) by mouth 2 (two) times daily.  Can increase to two tablets twice a day in the event of heavy bleeding (Patient not taking: Reported on 05/19/2019) 60 tablet 2  . Vitamin D, Ergocalciferol, (DRISDOL) 1.25 MG (50000 UT) CAPS capsule Take 1 capsule (50,000 Units total) by mouth every 7 (seven) days. (Patient not taking: Reported on 05/19/2019) 5 capsule 0  . albuterol (PROVENTIL HFA;VENTOLIN HFA) 108 (90 Base) MCG/ACT inhaler Inhale 1-2 puffs into the lungs every 6 (six) hours as needed for wheezing or shortness of breath. (Patient not taking: Reported on 05/19/2019) 1 Inhaler 5  . amLODipine (NORVASC) 10 MG tablet Take 1 tablet (10 mg total) by mouth daily. (Patient not taking: Reported on 05/19/2019) 90 tablet 1  . butalbital-aspirin-caffeine (FIORINAL) 50-325-40 MG capsule Take 1 capsule by mouth every 6 (six) hours as needed for headache. (Patient not taking: Reported on 10/07/2018) 14 capsule 0  . Calcium Carbonate-Vit D-Min (CALTRATE 600+D PLUS MINERALS) 600-800 MG-UNIT TABS Take 2 tablets by mouth daily. (Patient not taking: Reported on  05/19/2019) 60 tablet 5  . gabapentin (NEURONTIN) 300 MG capsule Take 1 capsule (300 mg total) by mouth 3 (three) times daily. (Patient not taking: Reported on 05/19/2019) 90 capsule 2  . losartan-hydrochlorothiazide (HYZAAR) 100-25 MG tablet Take 1 tablet by mouth every morning. (Patient not taking: Reported on 05/19/2019) 90 tablet 0  . montelukast (SINGULAIR) 10 MG tablet Take 1 tablet (10 mg total) by mouth daily at 3 pm. (1600) (Patient not taking: Reported on 05/19/2019) 90 tablet 1   No facility-administered medications prior to visit.     Allergies  Allergen Reactions  . Other Hives  . Eggs Or Egg-Derived Products Hives  . Fish Allergy Hives  . Iodine Hives    Review of Systems  Constitutional: Positive for malaise/fatigue.  HENT: Positive for congestion and sinus pain.   Respiratory: Positive for shortness of breath and wheezing.   Neurological: Positive for headaches.  Psychiatric/Behavioral: The patient has insomnia.   All other systems reviewed and are negative.      Objective:    Physical Exam  Constitutional: She is oriented to person, place, and time. She appears well-developed and well-nourished.  HENT:  Head: Normocephalic.  Neck: Neck supple.  Cardiovascular: Normal rate and regular rhythm.  Pulmonary/Chest: Effort normal. She has wheezes.  Abdominal: Soft. Bowel sounds are normal. She exhibits distension.  Musculoskeletal: Normal range of motion.  Neurological: She is oriented to person, place, and time.  Skin: Skin is warm.  Psychiatric: She has a normal mood and affect. Her behavior is normal. Judgment and thought content normal.    BP 113/70 (BP Location: Right Arm, Patient Position: Sitting, Cuff Size: Normal)   Pulse 75   Temp (!) 95.7 F (35.4 C) (Temporal)   Ht '5\' 1"'  (1.549 m)   Wt 191 lb 6.4 oz (86.8 kg)   LMP 08/04/2016 Comment: patient states bleeding is "spotting"  SpO2 97%   BMI 36.16 kg/m  Wt Readings from Last 3 Encounters:   05/19/19 191 lb 6.4 oz (86.8 kg)  08/24/18 183 lb (83 kg)  05/25/18 188 lb 8 oz (85.5 kg)    There are no preventive care reminders to display for this patient.  There are no preventive care reminders to display for this patient.   Lab Results  Component Value Date   TSH 1.300 04/08/2018   Lab Results  Component Value Date   WBC 6.1 11/24/2018   HGB 11.3 11/24/2018   HCT 34.7 11/24/2018   MCV 87 11/24/2018  PLT 249 11/24/2018   Lab Results  Component Value Date   NA 139 11/24/2018   K 4.0 11/24/2018   CO2 21 11/24/2018   GLUCOSE 88 11/24/2018   BUN 15 11/24/2018   CREATININE 0.75 11/24/2018   BILITOT 0.4 11/24/2018   ALKPHOS 87 11/24/2018   AST 15 11/24/2018   ALT 18 11/24/2018   PROT 7.4 11/24/2018   ALBUMIN 4.4 11/24/2018   CALCIUM 9.5 11/24/2018   ANIONGAP 7 05/25/2018   Lab Results  Component Value Date   CHOL 161 11/24/2018   Lab Results  Component Value Date   HDL 45 11/24/2018   Lab Results  Component Value Date   LDLCALC 105 (H) 11/24/2018   Lab Results  Component Value Date   TRIG 56 11/24/2018   Lab Results  Component Value Date   CHOLHDL 3.6 11/24/2018   Lab Results  Component Value Date   HGBA1C 5.1 04/08/2018       Assessment & Plan:   [name] was seen today for medication refill and asthma.  Diagnoses and all orders for this visit:  Moderate persistent asthma without complication Patient was on singular and SABA -     albuterol (VENTOLIN HFA) 108 (90 Base) MCG/ACT inhaler; Inhale 1-2 puffs into the lungs every 6 (six) hours as needed for wheezing or shortness of breath.)no LABA/ICS added budesonide-formoterol for better controlled . Explain SABA is for rescue only not for daily use and the new inhaler has steroids and may cause thrush need to rinse mouth after each use. -     albuterol (VENTOLIN HFA) 108 (90 Base) MCG/ACT inhaler; Inhale 1-2 puffs into the lungs every 6 (six) hours as needed for wheezing or shortness of  breath.  Medication refill -     losartan-hydrochlorothiazide (HYZAAR) 100-25 MG tablet; Take 1 tablet by mouth every morning. -     amLODipine (NORVASC) 10 MG tablet; Take 1 tablet (10 mg total) by mouth daily.  Migraine without status migrainosus, not intractable, unspecified migraine type Discussed trigger alcohol, age cheese ,chocolate denies consuming foods that may cause triggers. -     butalbital-aspirin-caffeine (FIORINAL) 50-325-40 MG capsule; Take 1 capsule by mouth every 6 (six) hours as needed for headache.   Vitamin D  History of low vitamin D recheck levels -     Vitamin D, 25-hydroxy  Essential hypertension Welled controlled on amlodipine 59m and losartan/HCTZ 100/261mtaken daily. Continue to follow low sodium diet and exercise as tolerated (asthma take SABA 30 mins prior to exercising)  -     CBC with Differential -     CMP14+EGFR; Future  Mixed hyperlipidemia History of screening to help control cholesterol decrease your fatty foods, red meat, cheese, milk and increase fiber like whole grains and veggies. You can also add a fiber supplement like Metamucil or Benefiber.  -     Lipid Panel  Acute non-recurrent frontal sinusitis Headaches frontal tenderness and cervical lymphoid tender with rhinitis treated with antibiotics for sinus infection  Other orders -     montelukast (SINGULAIR) 10 MG tablet; Take 1 tablet (10 mg total) by mouth daily in the afternoon. (1600) -     gabapentin (NEURONTIN) 300 MG capsule; Take 1 capsule (300 mg total) by mouth 3 (three) times daily. -     Calcium Carbonate-Vit D-Min (CALTRATE 600+D PLUS MINERALS) 600-800 MG-UNIT TABS; Take 2 tablets by mouth daily. -     budesonide-formoterol (SYMBICORT) 160-4.5 MCG/ACT inhaler; Inhale 2 puffs into the lungs 2 (  two) times daily. -     amoxicillin-clavulanate (AUGMENTIN) 875-125 MG tablet; Take 1 tablet by mouth 2 (two) times daily. -     fluconazole (DIFLUCAN) 150 MG tablet; Take 1 tablet (150  mg total) by mouth once for 1 dose.   Meds ordered this encounter  Medications  . montelukast (SINGULAIR) 10 MG tablet    Sig: Take 1 tablet (10 mg total) by mouth daily in the afternoon. (1600)    Dispense:  90 tablet    Refill:  1  . losartan-hydrochlorothiazide (HYZAAR) 100-25 MG tablet    Sig: Take 1 tablet by mouth every morning.    Dispense:  90 tablet    Refill:  1  . gabapentin (NEURONTIN) 300 MG capsule    Sig: Take 1 capsule (300 mg total) by mouth 3 (three) times daily.    Dispense:  90 capsule    Refill:  1    This prescription was filled on 01/09/2018. Any refills authorized will be placed on file.  No refills.  Patient needs an appointment for further refills or have PCP refill.  . Calcium Carbonate-Vit D-Min (CALTRATE 600+D PLUS MINERALS) 600-800 MG-UNIT TABS    Sig: Take 2 tablets by mouth daily.    Dispense:  60 tablet    Refill:  5  . butalbital-aspirin-caffeine (FIORINAL) 50-325-40 MG capsule    Sig: Take 1 capsule by mouth every 6 (six) hours as needed for headache.    Dispense:  14 capsule    Refill:  2  . amLODipine (NORVASC) 10 MG tablet    Sig: Take 1 tablet (10 mg total) by mouth daily.    Dispense:  90 tablet    Refill:  1  . albuterol (VENTOLIN HFA) 108 (90 Base) MCG/ACT inhaler    Sig: Inhale 1-2 puffs into the lungs every 6 (six) hours as needed for wheezing or shortness of breath.    Dispense:  6.7 g    Refill:  1  . budesonide-formoterol (SYMBICORT) 160-4.5 MCG/ACT inhaler    Sig: Inhale 2 puffs into the lungs 2 (two) times daily.    Dispense:  1 Inhaler    Refill:  3  . amoxicillin-clavulanate (AUGMENTIN) 875-125 MG tablet    Sig: Take 1 tablet by mouth 2 (two) times daily.    Dispense:  20 tablet    Refill:  0  . fluconazole (DIFLUCAN) 150 MG tablet    Sig: Take 1 tablet (150 mg total) by mouth once for 1 dose.    Dispense:  1 tablet    Refill:  0     Kerin Perna, NP

## 2019-05-19 NOTE — Progress Notes (Signed)
Pt complains of headaches for the past three days.

## 2019-05-19 NOTE — Patient Instructions (Signed)
Sinus Headache  A sinus headache happens when your sinuses get swollen or blocked (clogged). Sinuses are spaces behind the bones of your face and forehead. You may feel pain or pressure in your face, forehead, ears, or upper teeth. Sinus headaches can be mild or very bad. Follow these instructions at home: General instructions  If told: ? Apply a warm, moist washcloth to your face. This can help to lessen pain. ? Use a nasal saline wash. Follow the directions on the bottle or box. Medicines   Take over-the-counter and prescription medicines only as told by your doctor.  If you were prescribed an antibiotic medicine, take it as told by your doctor. Do not stop taking it even if you start to feel better.  Use a nose spray if your nose feels full of mucus (congested). Hydrate and humidify  Drink enough water to keep your pee (urine) pale yellow.  Use a cool mist humidifier to keep the humidity level in your home above 50%.  Breathe in steam for 10-15 minutes, 3-4 times a day or as told by your doctor. You can do this in the bathroom while a hot shower is running.  Try not to spend time in cool or dry air. Contact a doctor if:  You get more than one headache a week.  Light or sound bothers you.  You have a fever.  You feel sick to your stomach (nauseous) or you throw up (vomit).  Your headaches do not get better with treatment. Get help right away if:  You have trouble seeing.  You suddenly have very bad pain in your face or head.  You start to have quick, sudden movements or shaking that you cannot control (seizure).  You are confused.  You have a stiff neck. Summary  A sinus headache happens when your sinuses get swollen or blocked (clogged). Sinuses are spaces behind the bones of your face and forehead.  You may feel pain or pressure in your face, forehead, ears, or upper teeth.  Take over-the-counter and prescription medicines only as told by your doctor.  If  told, apply a warm, moist washcloth to your face. This can help to lessen pain. This information is not intended to replace advice given to you by your health care provider. Make sure you discuss any questions you have with your health care provider. Document Released: 10/17/2010 Document Revised: 05/30/2017 Document Reviewed: 03/28/2017 Elsevier Patient Education  2020 [employer]  

## 2019-05-20 LAB — LIPID PANEL
Chol/HDL Ratio: 3.8 ratio (ref 0.0–4.4)
Cholesterol, Total: 152 mg/dL (ref 100–199)
HDL: 40 mg/dL (ref >39)
LDL Chol Calc (NIH): 99 mg/dL (ref 0–99)
Triglycerides: 65 mg/dL (ref 0–149)
VLDL Cholesterol Cal: 13 mg/dL (ref 5–40)

## 2019-05-20 LAB — CMP14+EGFR
ALT: 28 IU/L (ref 0–32)
AST: 18 IU/L (ref 0–40)
Albumin/Globulin Ratio: 1.3 (ref 1.2–2.2)
Albumin: 4.2 g/dL (ref 3.8–4.8)
Alkaline Phosphatase: 100 IU/L (ref 39–117)
BUN/Creatinine Ratio: 14 (ref 9–23)
BUN: 10 mg/dL (ref 6–24)
Bilirubin Total: 0.3 mg/dL (ref 0.0–1.2)
CO2: 22 mmol/L (ref 20–29)
Calcium: 9.6 mg/dL (ref 8.7–10.2)
Chloride: 107 mmol/L — ABNORMAL HIGH (ref 96–106)
Creatinine, Ser: 0.69 mg/dL (ref 0.57–1.00)
GFR calc Af Amer: 120 mL/min/{1.73_m2} (ref >59)
GFR calc non Af Amer: 104 mL/min/{1.73_m2} (ref >59)
Globulin, Total: 3.2 g/dL (ref 1.5–4.5)
Glucose: 89 mg/dL (ref 65–99)
Potassium: 4.3 mmol/L (ref 3.5–5.2)
Sodium: 142 mmol/L (ref 134–144)
Total Protein: 7.4 g/dL (ref 6.0–8.5)

## 2019-05-20 LAB — CBC WITH DIFFERENTIAL/PLATELET
Basophils Absolute: 0 10*3/uL (ref 0.0–0.2)
Basos: 1 %
EOS (ABSOLUTE): 0.3 10*3/uL (ref 0.0–0.4)
Eos: 4 %
Hematocrit: 34 % (ref 34.0–46.6)
Hemoglobin: 11.2 g/dL (ref 11.1–15.9)
Immature Grans (Abs): 0 10*3/uL (ref 0.0–0.1)
Immature Granulocytes: 0 %
Lymphocytes Absolute: 2.8 10*3/uL (ref 0.7–3.1)
Lymphs: 44 %
MCH: 28.4 pg (ref 26.6–33.0)
MCHC: 32.9 g/dL (ref 31.5–35.7)
MCV: 86 fL (ref 79–97)
Monocytes Absolute: 0.5 10*3/uL (ref 0.1–0.9)
Monocytes: 7 %
Neutrophils Absolute: 2.8 10*3/uL (ref 1.4–7.0)
Neutrophils: 44 %
Platelets: 236 10*3/uL (ref 150–450)
RBC: 3.94 x10E6/uL (ref 3.77–5.28)
RDW: 13.5 % (ref 11.7–15.4)
WBC: 6.3 10*3/uL (ref 3.4–10.8)

## 2019-05-20 LAB — VITAMIN D 25 HYDROXY (VIT D DEFICIENCY, FRACTURES): Vit D, 25-Hydroxy: 32.4 ng/mL (ref 30.0–100.0)

## 2019-05-25 ENCOUNTER — Ambulatory Visit (INDEPENDENT_AMBULATORY_CARE_PROVIDER_SITE_OTHER): Payer: Self-pay | Admitting: Primary Care

## 2019-05-31 ENCOUNTER — Telehealth (INDEPENDENT_AMBULATORY_CARE_PROVIDER_SITE_OTHER): Payer: Self-pay

## 2019-05-31 NOTE — Telephone Encounter (Signed)
Patient is aware that labs are all normal. Nat Christen, CMA

## 2019-05-31 NOTE — Telephone Encounter (Signed)
-----   Message from Kerin Perna, NP sent at 05/25/2019 11:20 AM EST ----- I have reviewed all labs and they are normal

## 2019-06-03 ENCOUNTER — Other Ambulatory Visit (INDEPENDENT_AMBULATORY_CARE_PROVIDER_SITE_OTHER): Payer: Self-pay | Admitting: Primary Care

## 2019-06-03 ENCOUNTER — Telehealth (INDEPENDENT_AMBULATORY_CARE_PROVIDER_SITE_OTHER): Payer: Self-pay

## 2019-06-03 DIAGNOSIS — G5603 Carpal tunnel syndrome, bilateral upper limbs: Secondary | ICD-10-CM

## 2019-06-03 NOTE — Telephone Encounter (Signed)
FWD to PCP. Yesenia Simmons Yesenia Simmons, CMA  

## 2019-06-03 NOTE — Telephone Encounter (Signed)
Placed order for PT can change to ibuprofen 800 mg tid prn or refer pain management

## 2019-06-03 NOTE — Telephone Encounter (Signed)
Patient called in regards to her carpal tunnel syndrome stating she is taking   gabapentin (NEURONTIN) 300 MG capsule   But seems to not be working. Patient states she also is having headaches that comes and goes states she is drinking a lot of water through out the day. Patient needs referral to physical therapy since referral has expired.  Please advised [phone]  Thank you Whitney Post

## 2019-06-04 NOTE — Telephone Encounter (Signed)
Patient does not wish to switch medications. She will wait for physical therapy. Nat Christen, CMA

## 2019-06-07 ENCOUNTER — Ambulatory Visit: Payer: BLUE CROSS/BLUE SHIELD | Attending: Primary Care | Admitting: Physical Therapy

## 2019-06-15 ENCOUNTER — Telehealth (INDEPENDENT_AMBULATORY_CARE_PROVIDER_SITE_OTHER): Payer: Self-pay

## 2019-06-15 ENCOUNTER — Other Ambulatory Visit (INDEPENDENT_AMBULATORY_CARE_PROVIDER_SITE_OTHER): Payer: Self-pay | Admitting: Primary Care

## 2019-06-15 DIAGNOSIS — G5603 Carpal tunnel syndrome, bilateral upper limbs: Secondary | ICD-10-CM

## 2019-06-15 NOTE — Telephone Encounter (Signed)
Referred to ortho

## 2019-06-15 NOTE — Telephone Encounter (Signed)
FWD to PCP

## 2019-06-15 NOTE — Telephone Encounter (Signed)
Patient called to request a referral to a specialty clinic stating she is on able to attend physical therapy due to her starting a new job. Patient states that hercarpal tunnel syndrome is getting worst and her hands hurt during the night while she is trying to sleep.     Please advice [phone]

## 2019-06-18 ENCOUNTER — Other Ambulatory Visit (INDEPENDENT_AMBULATORY_CARE_PROVIDER_SITE_OTHER): Payer: Self-pay | Admitting: Primary Care

## 2019-06-18 DIAGNOSIS — J45909 Unspecified asthma, uncomplicated: Secondary | ICD-10-CM

## 2019-06-18 NOTE — Telephone Encounter (Signed)
FWD to PCP

## 2019-06-22 ENCOUNTER — Encounter: Payer: Self-pay | Admitting: Orthopaedic Surgery

## 2019-06-22 ENCOUNTER — Ambulatory Visit (INDEPENDENT_AMBULATORY_CARE_PROVIDER_SITE_OTHER): Payer: BLUE CROSS/BLUE SHIELD | Admitting: Orthopaedic Surgery

## 2019-06-22 ENCOUNTER — Other Ambulatory Visit: Payer: Self-pay

## 2019-06-22 DIAGNOSIS — G5601 Carpal tunnel syndrome, right upper limb: Secondary | ICD-10-CM

## 2019-06-22 DIAGNOSIS — G5602 Carpal tunnel syndrome, left upper limb: Secondary | ICD-10-CM

## 2019-06-22 MED ORDER — METHYLPREDNISOLONE ACETATE 40 MG/ML IJ SUSP
40.0000 mg | INTRAMUSCULAR | Status: AC | PRN
  Administered 2019-06-22: 40 mg

## 2019-06-22 MED ORDER — BUPIVACAINE HCL 0.5 % IJ SOLN
1.0000 mL | INTRAMUSCULAR | Status: AC | PRN
  Administered 2019-06-22: 16:00:00 1 mL

## 2019-06-22 MED ORDER — LIDOCAINE HCL 1 % IJ SOLN
1.0000 mL | INTRAMUSCULAR | Status: AC | PRN
  Administered 2019-06-22: 16:00:00 1 mL

## 2019-06-22 NOTE — Progress Notes (Signed)
Office Visit Note   Patient: Yesenia Simmons           Date of Birth: 1970/11/03           MRN: [id number] Visit Date: 06/22/2019              Requested by: Kerin Perna, NP [address] [place],  [place] [address] PCP: Kerin Perna, NP   Assessment & Plan: Visit Diagnoses:  1. Right carpal tunnel syndrome   2. Left carpal tunnel syndrome     Plan: Impression is moderate to severe right carpal tunnel syndrome that has clinically gotten worse since the studies done in 2018.  Mild left carpal tunnel syndrome.  Based on discussion patient would like to proceed with right carpal tunnel release and a left carpal tunnel injection which she tolerated well today in the office.  Risk benefits alternatives to surgery were discussed with the patient and she wishes to proceed.  Follow-Up Instructions: No follow-ups on file.   Orders:  No orders of the defined types were placed in this encounter.  No orders of the defined types were placed in this encounter.     Procedures: Hand/UE Inj: L carpal tunnel for carpal tunnel syndrome on 06/22/2019 4:05 PM Details: 25 G needle Medications: 1 mL lidocaine 1 %; 1 mL bupivacaine 0.5 %; 40 mg methylPREDNISolone acetate 40 MG/ML Outcome: tolerated well, no immediate complications Patient was prepped and draped in the usual sterile fashion.       Clinical Data: No additional findings.   Subjective: Chief Complaint  Patient presents with  . Left Hand - Pain  . Right Hand - Pain    Yesenia Simmons comes in today for evaluation of bilateral carpal tunnel syndrome worse on the right.  She has had nerve conduction study back in 2018 which showed moderate to severe on the right and mild on the left.  She states that her symptoms have gotten worse significantly recently.  She makes boxes for living.  She states that she cannot sleep at night because of her right hand and often wakes her up.  She takes Tylenol gabapentin which did not  help significantly.  She often feels like the entire extremity is numb and asleep.  She has tried wearing braces which do not help significantly.   Review of Systems  Constitutional: Negative.   HENT: Negative.   Eyes: Negative.   Respiratory: Negative.   Cardiovascular: Negative.   Endocrine: Negative.   Musculoskeletal: Negative.   Neurological: Negative.   Hematological: Negative.   Psychiatric/Behavioral: Negative.   All other systems reviewed and are negative.    Objective: Vital Signs: LMP 08/04/2016 Comment: patient states bleeding is "spotting"  Physical Exam Vitals and nursing note reviewed.  Constitutional:      Appearance: She is well-developed.  HENT:     Head: Normocephalic and atraumatic.  Pulmonary:     Effort: Pulmonary effort is normal.  Abdominal:     Palpations: Abdomen is soft.  Musculoskeletal:     Cervical back: Neck supple.  Skin:    General: Skin is warm.     Capillary Refill: Capillary refill takes less than 2 seconds.  Neurological:     Mental Status: She is alert and oriented to person, place, and time.  Psychiatric:        Behavior: Behavior normal.        Thought Content: Thought content normal.        Judgment: Judgment normal.  Ortho Exam Bilateral hand exam shows no muscle atrophy.  Mildly positive carpal tunnel compressive signs.  No neurovascular compromise. Specialty Comments:  No specialty comments available.  Imaging: No results found.   PMFS History: Patient Active Problem List   Diagnosis Date Noted  . Bilateral carpal tunnel syndrome 02/28/2017   Past Medical History:  Diagnosis Date  . Asthma   . CTS (carpal tunnel syndrome)   . Hypertension   . RBBB   . Sinus tachycardia     Family History  Problem Relation Age of Onset  . Stroke Maternal Grandmother   . Hypertension Mother   . Asthma Mother   . Hypertension Brother   . Hypertension Brother   . Heart attack Father   . Cancer Neg Hx     History  reviewed. No pertinent surgical history. Social History   Occupational History  . Occupation: in between jobs  Tobacco Use  . Smoking status: Never Smoker  . Smokeless tobacco: Never Used  Substance and Sexual Activity  . Alcohol use: No  . Drug use: No  . Sexual activity: Yes    Birth control/protection: Surgical

## 2019-07-15 ENCOUNTER — Encounter (INDEPENDENT_AMBULATORY_CARE_PROVIDER_SITE_OTHER): Payer: Self-pay | Admitting: Primary Care

## 2019-07-15 ENCOUNTER — Ambulatory Visit (INDEPENDENT_AMBULATORY_CARE_PROVIDER_SITE_OTHER): Payer: Self-pay | Admitting: Primary Care

## 2019-07-15 ENCOUNTER — Other Ambulatory Visit (HOSPITAL_COMMUNITY)
Admission: RE | Admit: 2019-07-15 | Discharge: 2019-07-15 | Disposition: A | Discharge status: Home / Self Care | Payer: Self-pay | Source: Ambulatory Visit | Attending: Primary Care | Admitting: Primary Care

## 2019-07-15 ENCOUNTER — Other Ambulatory Visit: Payer: Self-pay

## 2019-07-15 VITALS — BP 139/77 | HR 71 | Temp 97.3°F | Ht 61.0 in | Wt 194.2 lb

## 2019-07-15 DIAGNOSIS — Z124 Encounter for screening for malignant neoplasm of cervix: Secondary | ICD-10-CM | POA: Insufficient documentation

## 2019-07-15 DIAGNOSIS — Z760 Encounter for issue of repeat prescription: Secondary | ICD-10-CM

## 2019-07-15 DIAGNOSIS — Z1231 Encounter for screening mammogram for malignant neoplasm of breast: Secondary | ICD-10-CM

## 2019-07-15 DIAGNOSIS — G4700 Insomnia, unspecified: Secondary | ICD-10-CM

## 2019-07-15 DIAGNOSIS — J45909 Unspecified asthma, uncomplicated: Secondary | ICD-10-CM

## 2019-07-15 DIAGNOSIS — N898 Other specified noninflammatory disorders of vagina: Secondary | ICD-10-CM

## 2019-07-15 DIAGNOSIS — I10 Essential (primary) hypertension: Secondary | ICD-10-CM

## 2019-07-15 DIAGNOSIS — Z0001 Encounter for general adult medical examination with abnormal findings: Secondary | ICD-10-CM

## 2019-07-15 MED ORDER — MELATONIN 5 MG PO TABS: 5.0000 mg | ORAL_TABLET | Freq: Every evening | ORAL | 1 refills | Status: AC | PRN

## 2019-07-15 MED ORDER — ALBUTEROL SULFATE HFA 108 (90 BASE) MCG/ACT IN AERS: INHALATION_SPRAY | RESPIRATORY_TRACT | 1 refills | Status: DC

## 2019-07-15 MED ORDER — GABAPENTIN 300 MG PO CAPS: 300.0000 mg | ORAL_CAPSULE | Freq: Three times a day (TID) | ORAL | 1 refills | Status: DC

## 2019-07-15 MED ORDER — AMLODIPINE BESYLATE 10 MG PO TABS: 10.0000 mg | ORAL_TABLET | Freq: Every day | ORAL | 1 refills | Status: DC

## 2019-07-15 MED ORDER — MONTELUKAST SODIUM 10 MG PO TABS: 10.0000 mg | ORAL_TABLET | Freq: Every day | ORAL | 1 refills | Status: DC

## 2019-07-15 MED ORDER — CALTRATE 600+D PLUS MINERALS 600-800 MG-UNIT PO TABS: 2.0000 | ORAL_TABLET | Freq: Every day | ORAL | 5 refills | Status: AC

## 2019-07-15 MED ORDER — BUDESONIDE-FORMOTEROL FUMARATE 160-4.5 MCG/ACT IN AERO: 2.0000 | INHALATION_SPRAY | Freq: Two times a day (BID) | RESPIRATORY_TRACT | 3 refills | Status: DC

## 2019-07-15 MED ORDER — LOSARTAN POTASSIUM-HCTZ 100-25 MG PO TABS: 1.0000 | ORAL_TABLET | ORAL | 1 refills | Status: DC

## 2019-07-15 NOTE — Progress Notes (Signed)
Established Patient Office Visit  Subjective:  Patient ID: Yesenia Simmons, female    DOB: 04/13/1971  Age: 49 y.o. MRN: [id number]  CC:  Chief Complaint  Patient presents with  . Gynecologic Exam    HPI Yesenia Simmons presents for well woman exam. She is concern about vaginal discharge.  Past Medical History:  Diagnosis Date  . Asthma   . CTS (carpal tunnel syndrome)   . Hypertension   . RBBB   . Sinus tachycardia     History reviewed. No pertinent surgical history.  Family History  Problem Relation Age of Onset  . Stroke Maternal Grandmother   . Hypertension Mother   . Asthma Mother   . Hypertension Brother   . Hypertension Brother   . Heart attack Father   . Cancer Neg Hx     Social History   Socioeconomic History  . Marital status: Single    Spouse name: Not on file  . Number of children: 3  . Years of education: 6  . Highest education level: Not on file  Occupational History  . Occupation: in between jobs  Tobacco Use  . Smoking status: Never Smoker  . Smokeless tobacco: Never Used  Substance and Sexual Activity  . Alcohol use: No  . Drug use: No  . Sexual activity: Yes    Birth control/protection: Surgical  Other Topics Concern  . Not on file  Social History Narrative   Lives with son in a 2 story home.  Has 3 children.  Currently in between jobs.  Education: high school.     Social Determinants of Health   Financial Resource Strain:   . Difficulty of Paying Living Expenses: Not on file  Food Insecurity:   . Worried About Charity fundraiser in the Last Year: Not on file  . Ran Out of Food in the Last Year: Not on file  Transportation Needs:   . Lack of Transportation (Medical): Not on file  . Lack of Transportation (Non-Medical): Not on file  Physical Activity:   . Days of Exercise per Week: Not on file  . Minutes of Exercise per Session: Not on file  Stress:   . Feeling of Stress : Not on file  Social Connections:   . Frequency  of Communication with Friends and Family: Not on file  . Frequency of Social Gatherings with Friends and Family: Not on file  . Attends Religious Services: Not on file  . Active Member of Clubs or Organizations: Not on file  . Attends Archivist Meetings: Not on file  . Marital Status: Not on file  Intimate Partner Violence:   . Fear of Current or Ex-Partner: Not on file  . Emotionally Abused: Not on file  . Physically Abused: Not on file  . Sexually Abused: Not on file    Outpatient Medications Prior to Visit  Medication Sig Dispense Refill  . amoxicillin-clavulanate (AUGMENTIN) 875-125 MG tablet Take 1 tablet by mouth 2 (two) times daily. 20 tablet 0  . aspirin EC 81 MG tablet Take 1 tablet (81 mg total) by mouth daily. (Patient not taking: Reported on 05/19/2019) 90 tablet 3  . butalbital-aspirin-caffeine (FIORINAL) 50-325-40 MG capsule Take 1 capsule by mouth every 6 (six) hours as needed for headache. 14 capsule 2  . Cholecalciferol (VITAMIN D3 MAXIMUM STRENGTH PO) Take 1 tablet by mouth daily at 3 pm. (1600)    . megestrol (MEGACE) 40 MG tablet Take 1 tablet (40 mg total)  by mouth 2 (two) times daily. Can increase to two tablets twice a day in the event of heavy bleeding (Patient not taking: Reported on 05/19/2019) 60 tablet 2  . Vitamin D, Ergocalciferol, (DRISDOL) 1.25 MG (50000 UT) CAPS capsule Take 1 capsule (50,000 Units total) by mouth every 7 (seven) days. (Patient not taking: Reported on 05/19/2019) 5 capsule 0  . albuterol (VENTOLIN HFA) 108 (90 Base) MCG/ACT inhaler INHALE 1 TO 2 PUFFS into lungs EVERY 6 HOURS AS NEEDED FOR WHEEZING OR SHORTNESS OF BREATH 8.5 g 1  . amLODipine (NORVASC) 10 MG tablet Take 1 tablet (10 mg total) by mouth daily. 90 tablet 1  . budesonide-formoterol (SYMBICORT) 160-4.5 MCG/ACT inhaler Inhale 2 puffs into the lungs 2 (two) times daily. 1 Inhaler 3  . Calcium Carbonate-Vit D-Min (CALTRATE 600+D PLUS MINERALS) 600-800 MG-UNIT TABS Take 2  tablets by mouth daily. 60 tablet 5  . gabapentin (NEURONTIN) 300 MG capsule TAKE 1 CAPSULE BY MOUTH 3 TIMES DAILY 90 capsule 1  . losartan-hydrochlorothiazide (HYZAAR) 100-25 MG tablet Take 1 tablet by mouth every morning. 90 tablet 1  . montelukast (SINGULAIR) 10 MG tablet Take 1 tablet (10 mg total) by mouth daily in the afternoon. (1600) 90 tablet 1   No facility-administered medications prior to visit.    Allergies  Allergen Reactions  . Other Hives  . Eggs Or Egg-Derived Products Hives  . Fish Allergy Hives  . Iodine Hives    ROS Review of Systems  Genitourinary: Positive for vaginal discharge and vaginal pain.  All other systems reviewed and are negative.     Objective:    Physical Exam CONSTITUTIONAL: Well-developed, well-nourished obesed female in no acute distress.  HENT:  Normocephalic, atraumatic, External right and left ear normal. Oropharynx is clear and moist EYES: Conjunctivae and EOM are normal. Pupils are equal, round, and reactive to light. No scleral icterus.  NECK: Normal range of motion, supple, no masses.  Normal thyroid.  SKIN: Skin is warm and dry. No rash noted. Not diaphoretic. No erythema. No pallor. [place]: Alert and oriented to person, place, and time. Normal reflexes, muscle tone coordination. No cranial nerve deficit noted. PSYCHIATRIC: Normal mood and affect. Normal behavior. Normal judgment and thought content. CARDIOVASCULAR: Normal heart rate noted, regular rhythm RESPIRATORY: Clear to auscultation bilaterally. Effort and breath sounds normal, no problems with respiration noted. BREASTS: taught SBE and order mammogram  ABDOMEN: Soft, normal bowel sounds, no distention noted.  No tenderness, rebound or guarding.  PELVIC: Normal appearing external genitalia; normal appearing vaginal mucosa and cervix.  No abnormal discharge noted.  Pap smear obtained.  Normal uterine size, no other palpable masses, no uterine or adnexal  tenderness. MUSCULOSKELETAL: Normal range of motion. No tenderness.  No cyanosis, clubbing, or edema.  2+ distal pulses. BP (!) 173/79   Pulse 71   Temp (!) 97.3 F (36.3 C) (Oral)   Ht 5\' 1"  (1.549 m)   Wt 194 lb 3.2 oz (88.1 kg)   LMP 08/04/2016 Comment: patient states bleeding is "spotting"  SpO2 100%   BMI 36.69 kg/m  Wt Readings from Last 3 Encounters:  07/15/19 194 lb 3.2 oz (88.1 kg)  05/19/19 191 lb 6.4 oz (86.8 kg)  08/24/18 183 lb (83 kg)     Health Maintenance Due  Topic Date Due  . PAP SMEAR-Modifier  09/03/2019    There are no preventive care reminders to display for this patient.  Lab Results  Component Value Date   TSH 1.300 04/08/2018  Lab Results  Component Value Date   WBC 6.3 05/19/2019   HGB 11.2 05/19/2019   HCT 34.0 05/19/2019   MCV 86 05/19/2019   PLT 236 05/19/2019   Lab Results  Component Value Date   NA 142 05/19/2019   K 4.3 05/19/2019   CO2 22 05/19/2019   GLUCOSE 89 05/19/2019   BUN 10 05/19/2019   CREATININE 0.69 05/19/2019   BILITOT 0.3 05/19/2019   ALKPHOS 100 05/19/2019   AST 18 05/19/2019   ALT 28 05/19/2019   PROT 7.4 05/19/2019   ALBUMIN 4.2 05/19/2019   CALCIUM 9.6 05/19/2019   ANIONGAP 7 05/25/2018   Lab Results  Component Value Date   CHOL 152 05/19/2019   Lab Results  Component Value Date   HDL 40 05/19/2019   Lab Results  Component Value Date   LDLCALC 99 05/19/2019   Lab Results  Component Value Date   TRIG 65 05/19/2019   Lab Results  Component Value Date   CHOLHDL 3.8 05/19/2019   Lab Results  Component Value Date   HGBA1C 5.1 04/08/2018        [name] was seen today for gynecologic exam.  Diagnoses and all orders for this visit:  Cervical cancer screening -     Cytology - PAP([place])  Vaginal discharge -     Cervicovaginal ancillary only  Encounter for screening mammogram for malignant neoplasm of breast Patient completed application for BCCP while in clinic and application  has and faxed to [facility]. Patient aware that [facility] will contact her directly to schedule appointment.  Medication refill -     losartan-hydrochlorothiazide (HYZAAR) 100-25 MG tablet; Take 1 tablet by mouth every morning. -     amLODipine (NORVASC) 10 MG tablet; Take 1 tablet (10 mg total) by mouth daily.  Mild asthma, unspecified whether complicated, unspecified whether persistent -     albuterol (VENTOLIN HFA) 108 (90 Base) MCG/ACT inhaler; INHALE 1 TO 2 PUFFS into lungs EVERY 6 HOURS AS NEEDED FOR WHEEZING OR SHORTNESS OF BREATH  Essential hypertension Elevated rush to appointment from work elevated wil re check 138/79 much better Therapeutic goal 130./80 encourage low sodium diet and exercise for weight loss and lower CVD risk -     losartan-hydrochlorothiazide (HYZAAR) 100-25 MG tablet; Take 1 tablet by mouth every morning.  Insomnia, unspecified type Can try melatonin 5mg -15 mg at night for sleep, can also do benadryl 25-50mg  at night for sleep.  If this does not help we can try prescription medication.  Also here is some information about good sleep hygiene.   Other orders -     gabapentin (NEURONTIN) 300 MG capsule; Take 1 capsule (300 mg total) by mouth 3 (three) times daily. -     montelukast (SINGULAIR) 10 MG tablet; Take 1 tablet (10 mg total) by mouth daily in the afternoon. (1600) -     budesonide-formoterol (SYMBICORT) 160-4.5 MCG/ACT inhaler; Inhale 2 puffs into the lungs 2 (two) times daily. -     Calcium Carbonate-Vit D-Min (CALTRATE 600+D PLUS MINERALS) 600-800 MG-UNIT TABS; Take 2 tablets by mouth daily.     Meds ordered this encounter  Medications  . losartan-hydrochlorothiazide (HYZAAR) 100-25 MG tablet    Sig: Take 1 tablet by mouth every morning.    Dispense:  90 tablet    Refill:  1  . gabapentin (NEURONTIN) 300 MG capsule    Sig: Take 1 capsule (300 mg total) by mouth 3 (three) times daily.    Dispense:  90 capsule    Refill:  1    This prescription was  filled on 06/18/2019. Any refills authorized will be placed on file.  Marland Kitchen albuterol (VENTOLIN HFA) 108 (90 Base) MCG/ACT inhaler    Sig: INHALE 1 TO 2 PUFFS into lungs EVERY 6 HOURS AS NEEDED FOR WHEEZING OR SHORTNESS OF BREATH    Dispense:  8.5 g    Refill:  1    This prescription was filled on 06/18/2019. Any refills authorized will be placed on file.  . montelukast (SINGULAIR) 10 MG tablet    Sig: Take 1 tablet (10 mg total) by mouth daily in the afternoon. (1600)    Dispense:  90 tablet    Refill:  1  . amLODipine (NORVASC) 10 MG tablet    Sig: Take 1 tablet (10 mg total) by mouth daily.    Dispense:  90 tablet    Refill:  1  . budesonide-formoterol (SYMBICORT) 160-4.5 MCG/ACT inhaler    Sig: Inhale 2 puffs into the lungs 2 (two) times daily.    Dispense:  1 Inhaler    Refill:  3  . Calcium Carbonate-Vit D-Min (CALTRATE 600+D PLUS MINERALS) 600-800 MG-UNIT TABS    Sig: Take 2 tablets by mouth daily.    Dispense:  60 tablet    Refill:  5    Follow-up: Return for Bp re check Tele .    Kerin Perna, NP

## 2019-07-15 NOTE — Patient Instructions (Signed)
   Managing Your Hypertension Hypertension is commonly called high blood pressure. This is when the force of your blood pressing against the walls of your arteries is too strong. Arteries are blood vessels that carry blood from your heart throughout your body. Hypertension forces the heart to work harder to pump blood, and may cause the arteries to become narrow or stiff. Having untreated or uncontrolled hypertension can cause heart attack, stroke, kidney disease, and other problems. What are blood pressure readings? A blood pressure reading consists of a higher number over a lower number. Ideally, your blood pressure should be below 120/80. The first ("top") number is called the systolic pressure. It is a measure of the pressure in your arteries as your heart beats. The second ("bottom") number is called the diastolic pressure. It is a measure of the pressure in your arteries as the heart relaxes. What does my blood pressure reading mean? Blood pressure is classified into four stages. Based on your blood pressure reading, your health care provider may use the following stages to determine what type of treatment you need, if any. Systolic pressure and diastolic pressure are measured in a unit called mm Hg. Normal  Systolic pressure: below 120.  Diastolic pressure: below 80. Elevated  Systolic pressure: 120-129.  Diastolic pressure: below 80. Hypertension stage 1  Systolic pressure: 130-139.  Diastolic pressure: 80-89. Hypertension stage 2  Systolic pressure: 140 or above.  Diastolic pressure: 90 or above. What health risks are associated with hypertension? Managing your hypertension is an important responsibility. Uncontrolled hypertension can lead to:  A heart attack.  A stroke.  A weakened blood vessel (aneurysm).  Heart failure.  Kidney damage.  Eye damage.  Metabolic syndrome.  Memory and concentration problems. What changes can I make to manage my  hypertension? Hypertension can be managed by making lifestyle changes and possibly by taking medicines. Your health care provider will help you make a plan to bring your blood pressure within a normal range. Eating and drinking   Eat a diet that is high in fiber and potassium, and low in salt (sodium), added sugar, and fat. An example eating plan is called the DASH (Dietary Approaches to Stop Hypertension) diet. To eat this way: ? Eat plenty of fresh fruits and vegetables. Try to fill half of your plate at each meal with fruits and vegetables. ? Eat whole grains, such as whole wheat pasta, brown rice, or whole grain bread. Fill about one quarter of your plate with whole grains. ? Eat low-fat diary products. ? Avoid fatty cuts of meat, processed or cured meats, and poultry with skin. Fill about one quarter of your plate with lean proteins such as fish, chicken without skin, beans, eggs, and tofu. ? Avoid premade and processed foods. These tend to be higher in sodium, added sugar, and fat.  Reduce your daily sodium intake. Most people with hypertension should eat less than 1,500 mg of sodium a day.  Limit alcohol intake to no more than 1 drink a day for nonpregnant women and 2 drinks a day for men. One drink equals 12 oz of beer, 5 oz of wine, or 1 oz of hard liquor. Lifestyle  Work with your health care provider to maintain a healthy body weight, or to lose weight. Ask what an ideal weight is for you.  Get at least 30 minutes of exercise that causes your heart to beat faster (aerobic exercise) most days of the week. Activities may include walking, swimming, or biking.    Include exercise to strengthen your muscles (resistance exercise), such as weight lifting, as part of your weekly exercise routine. Try to do these types of exercises for 30 minutes at least 3 days a week.  Do not use any products that contain nicotine or tobacco, such as cigarettes and e-cigarettes. If you need help quitting,  ask your health care provider.  Control any long-term (chronic) conditions you have, such as high cholesterol or diabetes. Monitoring  Monitor your blood pressure at home as told by your health care provider. Your personal target blood pressure may vary depending on your medical conditions, your age, and other factors.  Have your blood pressure checked regularly, as often as told by your health care provider. Working with your health care provider  Review all the medicines you take with your health care provider because there may be side effects or interactions.  Talk with your health care provider about your diet, exercise habits, and other lifestyle factors that may be contributing to hypertension.  Visit your health care provider regularly. Your health care provider can help you create and adjust your plan for managing hypertension. Will I need medicine to control my blood pressure? Your health care provider may prescribe medicine if lifestyle changes are not enough to get your blood pressure under control, and if:  Your systolic blood pressure is 130 or higher.  Your diastolic blood pressure is 80 or higher. Take medicines only as told by your health care provider. Follow the directions carefully. Blood pressure medicines must be taken as prescribed. The medicine does not work as well when you skip doses. Skipping doses also puts you at risk for problems. Contact a health care provider if:  You think you are having a reaction to medicines you have taken.  You have repeated (recurrent) headaches.  You feel dizzy.  You have swelling in your ankles.  You have trouble with your vision. Get help right away if:  You develop a severe headache or confusion.  You have unusual weakness or numbness, or you feel faint.  You have severe pain in your chest or abdomen.  You vomit repeatedly.  You have trouble breathing. Summary  Hypertension is when the force of blood pumping  through your arteries is too strong. If this condition is not controlled, it may put you at risk for serious complications.  Your personal target blood pressure may vary depending on your medical conditions, your age, and other factors. For most people, a normal blood pressure is less than 120/80.  Hypertension is managed by lifestyle changes, medicines, or both. Lifestyle changes include weight loss, eating a healthy, low-sodium diet, exercising more, and limiting alcohol. This information is not intended to replace advice given to you by your health care provider. Make sure you discuss any questions you have with your health care provider. Document Revised: 10/09/2018 Document Reviewed: 05/15/2016 Elsevier Patient Education  2020 [employer]  

## 2019-07-19 LAB — CERVICOVAGINAL ANCILLARY ONLY
Bacterial Vaginitis (gardnerella): POSITIVE — AB
Candida Glabrata: NEGATIVE
Candida Vaginitis: NEGATIVE
Chlamydia: NEGATIVE
Comment: NEGATIVE
Comment: NEGATIVE
Comment: NEGATIVE
Comment: NEGATIVE
Comment: NEGATIVE
Comment: NORMAL
Neisseria Gonorrhea: NEGATIVE
Trichomonas: NEGATIVE

## 2019-07-19 LAB — CYTOLOGY - PAP
Adequacy: ABSENT
Diagnosis: NEGATIVE

## 2019-07-20 ENCOUNTER — Other Ambulatory Visit (INDEPENDENT_AMBULATORY_CARE_PROVIDER_SITE_OTHER): Payer: Self-pay | Admitting: Primary Care

## 2019-07-20 MED ORDER — METRONIDAZOLE 500 MG PO TABS: 500.0000 mg | ORAL_TABLET | Freq: Two times a day (BID) | ORAL | 0 refills | Status: DC

## 2019-07-21 ENCOUNTER — Telehealth (INDEPENDENT_AMBULATORY_CARE_PROVIDER_SITE_OTHER): Payer: Self-pay

## 2019-07-21 ENCOUNTER — Other Ambulatory Visit (INDEPENDENT_AMBULATORY_CARE_PROVIDER_SITE_OTHER): Payer: Self-pay | Admitting: Primary Care

## 2019-07-21 DIAGNOSIS — G43909 Migraine, unspecified, not intractable, without status migrainosus: Secondary | ICD-10-CM

## 2019-07-21 NOTE — Telephone Encounter (Signed)
FWD to PCP

## 2019-07-21 NOTE — Telephone Encounter (Signed)
-----   Message from Kerin Perna, NP sent at 07/19/2019  1:46 PM EST ----- Pap normal

## 2019-07-21 NOTE — Telephone Encounter (Signed)
Patient is aware that pap is normal. She is aware that STD screening was negative. BV positive; abx sent to pharmacy. Nat Christen, CMA

## 2019-08-02 ENCOUNTER — Other Ambulatory Visit: Payer: Self-pay

## 2019-08-02 ENCOUNTER — Telehealth: Payer: Self-pay | Admitting: Orthopaedic Surgery

## 2019-08-02 ENCOUNTER — Ambulatory Visit (INDEPENDENT_AMBULATORY_CARE_PROVIDER_SITE_OTHER): Payer: BLUE CROSS/BLUE SHIELD | Admitting: Primary Care

## 2019-08-02 DIAGNOSIS — G5603 Carpal tunnel syndrome, bilateral upper limbs: Secondary | ICD-10-CM

## 2019-08-02 NOTE — Telephone Encounter (Signed)
Kathlee Nations - Yes out of work note is fine.  Sherrie - has she been scheduled for surgery yet?

## 2019-08-02 NOTE — Progress Notes (Signed)
Virtual Visit via Telephone Note  I connected with JALYLA FISCUS on 08/02/19 at 10:50 AM EST by telephone and verified that I am speaking with the correct person using two identifiers.   I discussed the limitations, risks, security and privacy concerns of performing an evaluation and management service by telephone and the availability of in person appointments. I also discussed with the patient that there may be a patient responsible charge related to this service. The patient expressed understanding and agreed to proceed.   History of Present Illness: KEILEE LEMMON is having a tele for an acute visit right hand with increase pain working at [employer] which requires  lifting and pulling boxes. She has a scheduled surgical appointment for carpal tunnel with Dr, Erlinda Hong on 08/18/2019   Past Medical History:  Diagnosis Date  . Asthma   . CTS (carpal tunnel syndrome)   . Hypertension   . RBBB   . Sinus tachycardia    Current Outpatient Medications on File Prior to Visit  Medication Sig Dispense Refill  . albuterol (VENTOLIN HFA) 108 (90 Base) MCG/ACT inhaler INHALE 1 TO 2 PUFFS into lungs EVERY 6 HOURS AS NEEDED FOR WHEEZING OR SHORTNESS OF BREATH 8.5 g 1  . amLODipine (NORVASC) 10 MG tablet Take 1 tablet (10 mg total) by mouth daily. 90 tablet 1  . amoxicillin-clavulanate (AUGMENTIN) 875-125 MG tablet Take 1 tablet by mouth 2 (two) times daily. 20 tablet 0  . budesonide-formoterol (SYMBICORT) 160-4.5 MCG/ACT inhaler Inhale 2 puffs into the lungs 2 (two) times daily. 1 Inhaler 3  . butalbital-aspirin-caffeine (FIORINAL) 50-325-40 MG capsule TAKE 1 CAPSULE BY MOUTH EVERY 6 HOURS AS NEEDED FOR HEADACHE 14 capsule 2  . Calcium Carbonate-Vit D-Min (CALTRATE 600+D PLUS MINERALS) 600-800 MG-UNIT TABS Take 2 tablets by mouth daily. 60 tablet 5  . Cholecalciferol (VITAMIN D3 MAXIMUM STRENGTH PO) Take 1 tablet by mouth daily at 3 pm. (1600)    . gabapentin (NEURONTIN) 300 MG capsule Take 1  capsule (300 mg total) by mouth 3 (three) times daily. 90 capsule 1  . losartan-hydrochlorothiazide (HYZAAR) 100-25 MG tablet Take 1 tablet by mouth every morning. 90 tablet 1  . Melatonin 5 MG TABS Take 1 tablet (5 mg total) by mouth at bedtime as needed. 90 tablet 1  . metroNIDAZOLE (FLAGYL) 500 MG tablet Take 1 tablet (500 mg total) by mouth 2 (two) times daily. 14 tablet 0  . montelukast (SINGULAIR) 10 MG tablet Take 1 tablet (10 mg total) by mouth daily in the afternoon. (1600) 90 tablet 1  . Vitamin D, Ergocalciferol, (DRISDOL) 1.25 MG (50000 UT) CAPS capsule Take 1 capsule (50,000 Units total) by mouth every 7 (seven) days. 5 capsule 0  . aspirin EC 81 MG tablet Take 1 tablet (81 mg total) by mouth daily. (Patient not taking: Reported on 05/19/2019) 90 tablet 3  . megestrol (MEGACE) 40 MG tablet Take 1 tablet (40 mg total) by mouth 2 (two) times daily. Can increase to two tablets twice a day in the event of heavy bleeding (Patient not taking: Reported on 05/19/2019) 60 tablet 2   No current facility-administered medications on file prior to visit.   Observations/Objective:  Review of Systems  Musculoskeletal:       Bilateral carpel tunnel followed by ortho   All other systems reviewed and are negative.  Assessment and Plan: [name] was seen today for hand pain.  Diagnoses and all orders for this visit:  Bilateral carpal tunnel syndrome Followed  by ortho unable to work today states hand is swollen and her job is lifting and pulling boxes agreed to provide a work note and to follow up with ortho she does have an upcoming surgical appointment   Follow Up Instructions:    I discussed the assessment and treatment plan with the patient. The patient was provided an opportunity to ask questions and all were answered. The patient agreed with the plan and demonstrated an understanding of the instructions.   The patient was advised to call back or seek an in-person evaluation if the  symptoms worsen or if the condition fails to improve as anticipated.  I provided 8 minutes of non-face-to-face time during this encounter.   Kerin Perna, NP

## 2019-08-02 NOTE — Telephone Encounter (Signed)
Patient called advised her supervisor told her to get a note to be out of work Wednesday,Thursday and Friday because her hands hurt and she could not go to work. Patient said she is having surgery 02/17/20201. Patient asked if she can take sometime  off before surgery as well? The number to contact patient is [phone]

## 2019-08-02 NOTE — Progress Notes (Signed)
Needs letter for work, needs to be out due to pain.  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

## 2019-08-03 NOTE — Telephone Encounter (Signed)
Note made.  

## 2019-08-03 NOTE — Telephone Encounter (Signed)
Surgery is 08-18-19.

## 2019-08-11 ENCOUNTER — Other Ambulatory Visit: Payer: Self-pay

## 2019-08-11 ENCOUNTER — Encounter (HOSPITAL_BASED_OUTPATIENT_CLINIC_OR_DEPARTMENT_OTHER): Payer: Self-pay | Admitting: Orthopaedic Surgery

## 2019-08-14 ENCOUNTER — Inpatient Hospital Stay (HOSPITAL_COMMUNITY): Admission: RE | Admit: 2019-08-14 | Payer: PRIVATE HEALTH INSURANCE | Source: Ambulatory Visit

## 2019-08-18 ENCOUNTER — Ambulatory Visit (HOSPITAL_BASED_OUTPATIENT_CLINIC_OR_DEPARTMENT_OTHER): Admission: RE | Admit: 2019-08-18 | Payer: 59 | Source: Home / Self Care | Admitting: Orthopaedic Surgery

## 2019-08-18 SURGERY — CARPAL TUNNEL RELEASE
Anesthesia: Regional | Laterality: Right

## 2019-10-26 ENCOUNTER — Other Ambulatory Visit (INDEPENDENT_AMBULATORY_CARE_PROVIDER_SITE_OTHER): Payer: Self-pay | Admitting: Primary Care

## 2019-10-26 DIAGNOSIS — J45909 Unspecified asthma, uncomplicated: Secondary | ICD-10-CM

## 2019-10-26 NOTE — Telephone Encounter (Signed)
Sent to PCP ?

## 2019-11-16 ENCOUNTER — Other Ambulatory Visit: Payer: Self-pay

## 2019-11-16 ENCOUNTER — Encounter (INDEPENDENT_AMBULATORY_CARE_PROVIDER_SITE_OTHER): Payer: Self-pay | Admitting: Primary Care

## 2019-11-16 ENCOUNTER — Telehealth (INDEPENDENT_AMBULATORY_CARE_PROVIDER_SITE_OTHER): Payer: Self-pay | Admitting: Primary Care

## 2019-11-16 DIAGNOSIS — T7840XA Allergy, unspecified, initial encounter: Secondary | ICD-10-CM

## 2019-11-16 DIAGNOSIS — J4540 Moderate persistent asthma, uncomplicated: Secondary | ICD-10-CM

## 2019-11-16 DIAGNOSIS — I10 Essential (primary) hypertension: Secondary | ICD-10-CM

## 2019-11-16 MED ORDER — FLUTICASONE PROPIONATE 50 MCG/ACT NA SUSP: 2.0000 | Freq: Every day | NASAL | 6 refills | Status: AC

## 2019-11-16 NOTE — Progress Notes (Signed)
Virtual Visit via Telephone Note  I connected with Yesenia Simmons on 11/16/19 at  8:30 AM EDT by telephone and verified that I am speaking with the correct person using two identifiers.   I discussed the limitations, risks, security and privacy concerns of performing an evaluation and management service by telephone and the availability of in person appointments. I also discussed with the patient that there may be a patient responsible charge related to this service. The patient expressed understanding and agreed to proceed.   History of Present Illness: Ms. Yesenia Simmons is a 49 year old female having a tele visit for Blood pressure follow up and she is concerned with recent shortness of breath.Pt states she has been experiencing more shortness of breath. She voices concerns if she should get tested for covid and is negative  then is it ok to get the vaccine. ABSOLUTELY She reports her blood pressure readings Bp yesterday before bed 116/62 and Bp this am without medication 118/70.   Past Medical History:  Diagnosis Date  . Arthritis   . Asthma   . CTS (carpal tunnel syndrome)   . Hypertension   . RBBB   . Sinus tachycardia    Current Outpatient Medications on File Prior to Visit  Medication Sig Dispense Refill  . amLODipine (NORVASC) 10 MG tablet Take 1 tablet (10 mg total) by mouth daily. 90 tablet 1  . aspirin EC 81 MG tablet Take 1 tablet (81 mg total) by mouth daily. 90 tablet 3  . budesonide-formoterol (SYMBICORT) 160-4.5 MCG/ACT inhaler Inhale 2 puffs into the lungs 2 (two) times daily. 1 Inhaler 3  . butalbital-aspirin-caffeine (FIORINAL) 50-325-40 MG capsule TAKE 1 CAPSULE BY MOUTH EVERY 6 HOURS AS NEEDED FOR HEADACHE 14 capsule 2  . Calcium Carbonate-Vit D-Min (CALTRATE 600+D PLUS MINERALS) 600-800 MG-UNIT TABS Take 2 tablets by mouth daily. 60 tablet 5  . Cholecalciferol (VITAMIN D3 MAXIMUM STRENGTH PO) Take 1 tablet by mouth daily at 3 pm. (1600)    . gabapentin  (NEURONTIN) 300 MG capsule TAKE 1 CAPSULE BY MOUTH 3 TIMES DAILY 90 capsule 1  . losartan-hydrochlorothiazide (HYZAAR) 100-25 MG tablet Take 1 tablet by mouth every morning. 90 tablet 1  . megestrol (MEGACE) 40 MG tablet Take 1 tablet (40 mg total) by mouth 2 (two) times daily. Can increase to two tablets twice a day in the event of heavy bleeding 60 tablet 2  . Melatonin 5 MG TABS Take 1 tablet (5 mg total) by mouth at bedtime as needed. 90 tablet 1  . montelukast (SINGULAIR) 10 MG tablet TAKE 1 TABLET BY MOUTH EVERY AFTERNOON 90 tablet 1  . Vitamin D, Ergocalciferol, (DRISDOL) 1.25 MG (50000 UT) CAPS capsule Take 1 capsule (50,000 Units total) by mouth every 7 (seven) days. 5 capsule 0   No current facility-administered medications on file prior to visit.   Observations/Objective: Review of Systems  HENT:       Rhinitis   Eyes:       Watery eyes   Respiratory: Positive for shortness of breath.   Endo/Heme/Allergies: Positive for environmental allergies.       Seasonal  All other systems reviewed and are negative.  Assessment and Plan: Alyzabeth was seen today for hypertension and shortness of breath.  Diagnoses and all orders for this visit:  Allergy, initial encounter Pollen levels is high , flowers and trees blooming causing allergies flaring up. May take over the counter antihistamines as needed -     fluticasone (  FLONASE) 50 MCG/ACT nasal spray; Place 2 sprays into both nostrils daily.  Essential hypertension Blood pressure is well controlled on amlodipine 10mg  daily and Hyzaar  100/25 mg (continue) low-sodium, DASH diet, medication compliance, 150 minutes of moderate intensity exercise per week. Discussed medication compliance, adverse effects.  Moderate persistent asthma without complication Pollen and seasonally allergies can cause asthma exacerbation to include shortness of breath. Take medication as prescribed singular 10 mg daily , Symbicort 2 puffs twice daily reminded to  rinse mouth after each use and use SBA only as needed    Follow Up Instructions:    I discussed the assessment and treatment plan with the patient. The patient was provided an opportunity to ask questions and all were answered. The patient agreed with the plan and demonstrated an understanding of the instructions.   The patient was advised to call back or seek an in-person evaluation if the symptoms worsen or if the condition fails to improve as anticipated.  I provided 12 minutes of non-face-to-face time during this encounter.   Kerin Perna, NP

## 2019-11-16 NOTE — Progress Notes (Signed)
Pt states she has been experiencing more shortness of breath. Pt wants to know if she should get tested for covid and then get the vaccine. She does have allergy for flu shot.  Bp yesterday before bed 116/62 Bp this am without medication 118/70

## 2019-11-25 ENCOUNTER — Emergency Department (HOSPITAL_BASED_OUTPATIENT_CLINIC_OR_DEPARTMENT_OTHER)
Admission: EM | Admit: 2019-11-25 | Discharge: 2019-11-26 | Disposition: A | Discharge status: Home / Self Care | Payer: Self-pay | Attending: Emergency Medicine | Admitting: Emergency Medicine

## 2019-11-25 ENCOUNTER — Encounter (HOSPITAL_BASED_OUTPATIENT_CLINIC_OR_DEPARTMENT_OTHER): Payer: Self-pay

## 2019-11-25 ENCOUNTER — Other Ambulatory Visit: Payer: Self-pay

## 2019-11-25 ENCOUNTER — Emergency Department (HOSPITAL_BASED_OUTPATIENT_CLINIC_OR_DEPARTMENT_OTHER): Payer: Self-pay

## 2019-11-25 DIAGNOSIS — Z91012 Allergy to eggs: Secondary | ICD-10-CM | POA: Insufficient documentation

## 2019-11-25 DIAGNOSIS — T1490XA Injury, unspecified, initial encounter: Secondary | ICD-10-CM

## 2019-11-25 DIAGNOSIS — Y999 Unspecified external cause status: Secondary | ICD-10-CM | POA: Insufficient documentation

## 2019-11-25 DIAGNOSIS — Z91018 Allergy to other foods: Secondary | ICD-10-CM | POA: Insufficient documentation

## 2019-11-25 DIAGNOSIS — Y939 Activity, unspecified: Secondary | ICD-10-CM | POA: Insufficient documentation

## 2019-11-25 DIAGNOSIS — S92512A Displaced fracture of proximal phalanx of left lesser toe(s), initial encounter for closed fracture: Secondary | ICD-10-CM | POA: Insufficient documentation

## 2019-11-25 DIAGNOSIS — Y929 Unspecified place or not applicable: Secondary | ICD-10-CM | POA: Insufficient documentation

## 2019-11-25 DIAGNOSIS — J45909 Unspecified asthma, uncomplicated: Secondary | ICD-10-CM

## 2019-11-25 DIAGNOSIS — R062 Wheezing: Secondary | ICD-10-CM | POA: Insufficient documentation

## 2019-11-25 DIAGNOSIS — Z79899 Other long term (current) drug therapy: Secondary | ICD-10-CM | POA: Insufficient documentation

## 2019-11-25 DIAGNOSIS — S92502A Displaced unspecified fracture of left lesser toe(s), initial encounter for closed fracture: Secondary | ICD-10-CM

## 2019-11-25 DIAGNOSIS — I10 Essential (primary) hypertension: Secondary | ICD-10-CM | POA: Insufficient documentation

## 2019-11-25 DIAGNOSIS — W2209XA Striking against other stationary object, initial encounter: Secondary | ICD-10-CM | POA: Insufficient documentation

## 2019-11-25 HISTORY — DX: Unspecified osteoarthritis, unspecified site: M19.90

## 2019-11-25 NOTE — ED Triage Notes (Signed)
Pt c/o toe pain of the L third digit. Pt states she hit it on furniture today. Pt has swelling and bruising noted.

## 2019-11-26 ENCOUNTER — Other Ambulatory Visit (INDEPENDENT_AMBULATORY_CARE_PROVIDER_SITE_OTHER): Payer: Self-pay | Admitting: Primary Care

## 2019-11-26 DIAGNOSIS — G43909 Migraine, unspecified, not intractable, without status migrainosus: Secondary | ICD-10-CM

## 2019-11-26 MED ORDER — ALBUTEROL SULFATE HFA 108 (90 BASE) MCG/ACT IN AERS: 2.0000 | INHALATION_SPRAY | RESPIRATORY_TRACT | 1 refills | Status: AC | PRN

## 2019-11-26 NOTE — ED Provider Notes (Addendum)
[name]   CSN: [id number] Arrival date & time: 11/25/19  2320   History Chief Complaint  Patient presents with  . Toe Pain    Yesenia Simmons is a 49 y.o. female.  The history is provided by the patient.  Toe Pain  She hit her left foot against some furniture and is complaining of pain in the left third toe.  Past Medical History:  Diagnosis Date  . Arthritis   . Asthma   . CTS (carpal tunnel syndrome)   . Hypertension   . RBBB   . Sinus tachycardia     Patient Active Problem List   Diagnosis Date Noted  . Bilateral carpal tunnel syndrome 02/28/2017    Past Surgical History:  Procedure Laterality Date  . TUBAL LIGATION       OB History    Gravida  5   Para  3   Term  2   Preterm  1   AB  2   Living  3     SAB  2   TAB      Ectopic      Multiple      Live Births  3           Family History  Problem Relation Age of Onset  . Stroke Maternal Grandmother   . Hypertension Mother   . Asthma Mother   . Hypertension Brother   . Hypertension Brother   . Heart attack Father   . Cancer Neg Hx     Social History   Tobacco Use  . Smoking status: Never Smoker  . Smokeless tobacco: Never Used  Substance Use Topics  . Alcohol use: Yes    Comment: occ  . Drug use: No    Home Medications Prior to Admission medications   Medication Sig Start Date End Date Taking? Authorizing Provider  albuterol (VENTOLIN HFA) 108 (90 Base) MCG/ACT inhaler INHALE 1 TO 2 PUFFS into lungs EVERY 6 HOURS AS NEEDED FOR WHEEZING OR SHORTNESS OF BREATH Patient not taking: Reported on 11/16/2019 11/01/19   Kerin Perna, NP  amLODipine (NORVASC) 10 MG tablet Take 1 tablet (10 mg total) by mouth daily. 07/15/19   Kerin Perna, NP  aspirin EC 81 MG tablet Take 1 tablet (81 mg total) by mouth daily. 04/01/17   Clent Demark, PA-C  budesonide-formoterol [facility]) 160-4.5 MCG/ACT inhaler Inhale 2 puffs  into the lungs 2 (two) times daily. 07/15/19   Kerin Perna, NP  butalbital-aspirin-caffeine [facility]) [phone] MG capsule TAKE 1 CAPSULE BY MOUTH EVERY 6 HOURS AS NEEDED FOR HEADACHE 07/24/19   Kerin Perna, NP  Calcium Carbonate-Vit D-Min (CALTRATE 600+D PLUS MINERALS) 600-800 MG-UNIT TABS Take 2 tablets by mouth daily. 07/15/19   Kerin Perna, NP  Cholecalciferol (VITAMIN D3 MAXIMUM STRENGTH PO) Take 1 tablet by mouth daily at 3 pm. (1600)    [provider]  fluticasone (FLONASE) 50 MCG/ACT nasal spray Place 2 sprays into both nostrils daily. 11/16/19   Kerin Perna, NP  gabapentin (NEURONTIN) 300 MG capsule TAKE 1 CAPSULE BY MOUTH 3 TIMES DAILY 11/01/19   Kerin Perna, NP  losartan-hydrochlorothiazide (HYZAAR) 100-25 MG tablet Take 1 tablet by mouth every morning. 07/15/19   Kerin Perna, NP  megestrol (MEGACE) 40 MG tablet Take 1 tablet (40 mg total) by mouth 2 (two) times daily. Can increase to two tablets twice a day in the event of heavy bleeding  03/29/19   Lavonia Drafts, MD  Melatonin 5 MG TABS Take 1 tablet (5 mg total) by mouth at bedtime as needed. 07/15/19   Kerin Perna, NP  montelukast (SINGULAIR) 10 MG tablet TAKE 1 TABLET BY MOUTH EVERY AFTERNOON 11/01/19   Kerin Perna, NP  Vitamin D, Ergocalciferol, (DRISDOL) 1.25 MG (50000 UT) CAPS capsule Take 1 capsule (50,000 Units total) by mouth every 7 (seven) days. 11/29/18   Kerin Perna, NP    Allergies    Other, Eggs or egg-derived products, Fish allergy, and Iodine  Review of Systems   Review of Systems  All other systems reviewed and are negative.   Physical Exam Updated Vital Signs BP 127/76 (BP Location: Left Arm)   Pulse 90   Temp 99.1 F (37.3 C) (Oral)   Resp 20   Ht 5\' 1"  (1.549 m)   Wt 85.3 kg   LMP 08/04/2016 Comment: patient states bleeding is "spotting"  SpO2 95%   BMI 35.52 kg/m   Physical Exam Vitals and nursing note reviewed.    49 year old female, resting comfortably and in no acute distress. Vital signs are normal. Oxygen saturation is 95%, which is normal. Head is normocephalic and atraumatic. PERRLA, EOMI. Oropharynx is clear. Neck is nontender and supple without adenopathy or JVD. Back is nontender and there is no CVA tenderness. Lungs have moderate wheezing diffusely.  There are no rales or rhonchi. Chest is nontender. Heart has regular rate and rhythm without murmur. Abdomen is soft, flat, nontender without masses or hepatosplenomegaly and peristalsis is normoactive. Extremities: There is mild swelling around the left third toe and left third MTP joint with with moderate tenderness in the same area.  Distal neurovascular exam is intact.  Remainder of extremity exam is normal. Skin is warm and dry without rash. Neurologic: Mental status is normal, cranial nerves are intact, there are no motor or sensory deficits.  ED Results / Procedures / Treatments    Radiology DG Foot Complete Left  Result Date: 11/26/2019 CLINICAL DATA:  Struck left middle toe upon furniture EXAM: LEFT FOOT - COMPLETE 3+ VIEW COMPARISON:  Ankle radiograph 04/01/2017 FINDINGS: Obliquely oriented fracture through the diaphysis of the third proximal phalanx with slight lateral displacement and minimal foreshortening. Surrounding swelling of the third digit with more diffuse mild swelling of the forefoot. There is a slightly irregular appearance of the nail bed of the third digit as well. No other acute fracture or traumatic malalignment. IMPRESSION: 1. Obliquely oriented fracture through the diaphysis of the third proximal phalanx with slight lateral displacement and minimal foreshortening. 2. Slightly irregular appearance of the nail bed of the third digit, correlate with visual inspection. 3. Associated swelling of the third digit and forefoot. Electronically Signed   By: Lovena Le M.D.   On: 11/26/2019 00:22    Procedures Procedures    Medications Ordered in ED Medications - No data to display  ED Course  I have reviewed the triage vital signs and the nursing notes.  Pertinent imaging results that were available during my care of the patient were reviewed by me and considered in my medical decision making (see chart for details).  MDM Rules/Calculators/A&P Injury of the left third toe.  X-ray shows an oblique fracture through the proximal phalanx.  Toe was buddy taped and patient is sent home with instructions to avoid apply ice, keep toes buddy tape for the next 3 weeks, use over-the-counter analgesics as needed for pain.  Follow-up with orthopedics  if needed.  Wheezing.  Patient was not aware of it and she is not dyspneic.  Given a prescription for albuterol inhaler to use as needed.  Final Clinical Impression(s) / ED Diagnoses Final diagnoses:  Blunt trauma  Closed fracture of phalanx of left third toe, initial encounter    Rx / DC Orders ED Discharge Orders    None       Delora Fuel, MD [id number]    Delora Fuel, MD [id number] [phone]

## 2019-11-26 NOTE — Discharge Instructions (Signed)
Apply ice, keep foot elevated as much as possible.  Buddy tape the injured toe for the next 3 weeks, then continue as needed for pain control.  Take ibuprofen, naproxen, or acetaminophen as needed for pain.  Your lungs had considerable amount of wheezing today.  Use the inhaler as needed to control the wheezing.

## 2019-11-26 NOTE — Telephone Encounter (Signed)
Sent to PCP ?

## 2019-11-28 MED ORDER — BUDESONIDE-FORMOTEROL FUMARATE 160-4.5 MCG/ACT IN AERO: 2.0000 | INHALATION_SPRAY | Freq: Two times a day (BID) | RESPIRATORY_TRACT | 3 refills | Status: DC

## 2019-12-13 ENCOUNTER — Encounter (HOSPITAL_COMMUNITY): Payer: Self-pay

## 2019-12-13 ENCOUNTER — Other Ambulatory Visit: Payer: Self-pay

## 2019-12-13 ENCOUNTER — Ambulatory Visit (HOSPITAL_COMMUNITY)
Admission: EM | Admit: 2019-12-13 | Discharge: 2019-12-13 | Disposition: A | Discharge status: Home / Self Care | Payer: 59 | Attending: Internal Medicine | Admitting: Internal Medicine

## 2019-12-13 DIAGNOSIS — J208 Acute bronchitis due to other specified organisms: Secondary | ICD-10-CM | POA: Diagnosis present

## 2019-12-13 DIAGNOSIS — Z20822 Contact with and (suspected) exposure to covid-19: Secondary | ICD-10-CM | POA: Diagnosis not present

## 2019-12-13 DIAGNOSIS — J4531 Mild persistent asthma with (acute) exacerbation: Secondary | ICD-10-CM

## 2019-12-13 DIAGNOSIS — B353 Tinea pedis: Secondary | ICD-10-CM | POA: Diagnosis not present

## 2019-12-13 MED ORDER — MUCINEX 600 MG PO TB12: 600.0000 mg | ORAL_TABLET | Freq: Two times a day (BID) | ORAL | 0 refills | Status: AC

## 2019-12-13 MED ORDER — BENZONATATE 100 MG PO CAPS: 100.0000 mg | ORAL_CAPSULE | Freq: Three times a day (TID) | ORAL | 0 refills | Status: DC

## 2019-12-13 MED ORDER — PREDNISONE 20 MG PO TABS: 20.0000 mg | ORAL_TABLET | Freq: Every day | ORAL | 0 refills | Status: AC

## 2019-12-13 MED ORDER — FLUCONAZOLE 150 MG PO TABS: 150.0000 mg | ORAL_TABLET | ORAL | 0 refills | Status: AC

## 2019-12-13 NOTE — ED Triage Notes (Signed)
Pt c/o productive cough w/yellow mucous, HA, nasal congestion, SOB, diarrheax2 days. Pt has non labored breathing. Pt able to talk in complete sentences. Pt states she thinks she has a fungal infection on her left foot.

## 2019-12-14 LAB — SARS CORONAVIRUS 2 (TAT 6-24 HRS): SARS Coronavirus 2: NEGATIVE

## 2019-12-27 ENCOUNTER — Encounter (HOSPITAL_COMMUNITY): Payer: Self-pay | Admitting: Emergency Medicine

## 2019-12-27 ENCOUNTER — Other Ambulatory Visit: Payer: Self-pay

## 2019-12-27 ENCOUNTER — Ambulatory Visit (HOSPITAL_COMMUNITY)
Admission: EM | Admit: 2019-12-27 | Discharge: 2019-12-27 | Disposition: A | Discharge status: Home / Self Care | Payer: Self-pay | Attending: Physician Assistant | Admitting: Physician Assistant

## 2019-12-27 DIAGNOSIS — M79644 Pain in right finger(s): Secondary | ICD-10-CM

## 2019-12-27 DIAGNOSIS — L03011 Cellulitis of right finger: Secondary | ICD-10-CM

## 2019-12-27 MED ORDER — SULFAMETHOXAZOLE-TRIMETHOPRIM 800-160 MG PO TABS: 1.0000 | ORAL_TABLET | Freq: Two times a day (BID) | ORAL | 0 refills | Status: AC

## 2019-12-27 MED ORDER — LIDOCAINE HCL (PF) 1 % IJ SOLN
INTRAMUSCULAR | Status: AC
  Filled 2019-12-27: qty 4

## 2019-12-27 MED ORDER — CEFTRIAXONE SODIUM 1 G IJ SOLR
INTRAMUSCULAR | Status: AC
  Filled 2019-12-27: qty 10

## 2019-12-27 MED ORDER — TRAMADOL HCL 50 MG PO TABS: 50.0000 mg | ORAL_TABLET | Freq: Four times a day (QID) | ORAL | 0 refills | Status: AC | PRN

## 2019-12-27 MED ORDER — CEFTRIAXONE SODIUM 1 G IJ SOLR
1.0000 g | Freq: Once | INTRAMUSCULAR | Status: AC
  Administered 2019-12-27: 1 g via INTRAMUSCULAR

## 2019-12-27 NOTE — Discharge Instructions (Signed)
Take the bactrim 2 times a day until follow up Use the splint  Take the tramadol as prescribed You may take tylenol in addition to this  Call the numbed supplied tomorrow to have close follow up

## 2019-12-27 NOTE — ED Triage Notes (Signed)
Patient has swelling and pain to right hand.  Pain started yesterday.  Today pain is radiating up right arm.  Patient states she has carpal tunnel.

## 2019-12-27 NOTE — ED Provider Notes (Addendum)
[facility]    CSN: [id number] Arrival date & time: 12/27/19  1442      History   Chief Complaint Chief Complaint  Patient presents with  . Hand Problem    HPI Yesenia Simmons is a 49 y.o. female.   Patient reports for 2-day history of worsening thumb and hand pain.  She reports redness and swelling has developed over the last 1 to 2 days.  She reports it is very painful and difficult to move the thumb.  She reports she has never had swelling and redness like this with her previous episodes of carpal tunnel and does not believe this is the same thing.  She did not injure the hand.  No known cuts or wounds.  She reports she works with her hands frequently.  She has never had an issue with her thumb like this.  Denies any fevers or chills.  No history of gout.  Patient has tried Tylenol, ibuprofen and other pain relievers without any improvement.  Pain has been persistent severe.      Past Medical History:  Diagnosis Date  . Arthritis   . Asthma   . CTS (carpal tunnel syndrome)   . Hypertension   . RBBB   . Sinus tachycardia     Patient Active Problem List   Diagnosis Date Noted  . Bilateral carpal tunnel syndrome 02/28/2017    Past Surgical History:  Procedure Laterality Date  . TUBAL LIGATION      OB History    Gravida  5   Para  3   Term  2   Preterm  1   AB  2   Living  3     SAB  2   TAB      Ectopic      Multiple      Live Births  3            Home Medications    Prior to Admission medications   Medication Sig Start Date End Date Taking? Authorizing Provider  albuterol (VENTOLIN HFA) 108 (90 Base) MCG/ACT inhaler Inhale 2 puffs into the lungs every 4 (four) hours as needed for wheezing or shortness of breath (or coughing). 12/06/35  Yes Delora Fuel, MD  amLODipine (NORVASC) 10 MG tablet Take 1 tablet (10 mg total) by mouth daily. 07/15/19  Yes Kerin Perna, NP  aspirin EC 81 MG tablet Take 1 tablet (81 mg total)  by mouth daily. 04/01/17  Yes Clent Demark, PA-C  budesonide-formoterol [facility]) 160-4.5 MCG/ACT inhaler Inhale 2 puffs into the lungs 2 (two) times daily. 11/28/19  Yes Edwards, Milford Cage, NP  butalbital-aspirin-caffeine [facility]) 50-325-40 MG capsule TAKE 1 CAPSULE BY MOUTH EVERY 6 HOURS AS NEEDED FOR HEADACHE 12/03/19  Yes Kerin Perna, NP  Calcium Carbonate-Vit D-Min (CALTRATE 600+D PLUS MINERALS) 600-800 MG-UNIT TABS Take 2 tablets by mouth daily. 07/15/19  Yes Kerin Perna, NP  Cholecalciferol (VITAMIN D3 MAXIMUM STRENGTH PO) Take 1 tablet by mouth daily at 3 pm. (1600)   Yes [provider]  fluconazole (DIFLUCAN) 150 MG tablet Take 1 tablet (150 mg total) by mouth once a week for 6 doses. 12/13/19 01/18/20 Yes Lamptey, Myrene Galas, MD  fluticasone (FLONASE) 50 MCG/ACT nasal spray Place 2 sprays into both nostrils daily. 11/16/19  Yes Kerin Perna, NP  gabapentin (NEURONTIN) 300 MG capsule TAKE 1 CAPSULE BY MOUTH 3 TIMES DAILY 11/01/19  Yes Kerin Perna, NP  losartan-hydrochlorothiazide [facility]) 100-25  MG tablet Take 1 tablet by mouth every morning. 07/15/19  Yes Kerin Perna, NP  megestrol (MEGACE) 40 MG tablet Take 1 tablet (40 mg total) by mouth 2 (two) times daily. Can increase to two tablets twice a day in the event of heavy bleeding 03/29/19  Yes Lavonia Drafts, MD  Melatonin 5 MG TABS Take 1 tablet (5 mg total) by mouth at bedtime as needed. 07/15/19  Yes Kerin Perna, NP  montelukast (SINGULAIR) 10 MG tablet TAKE 1 TABLET BY MOUTH EVERY AFTERNOON 11/01/19  Yes Kerin Perna, NP  Vitamin D, Ergocalciferol, (DRISDOL) 1.25 MG (50000 UT) CAPS capsule Take 1 capsule (50,000 Units total) by mouth every 7 (seven) days. 11/29/18  Yes Kerin Perna, NP  benzonatate (TESSALON) 100 MG capsule Take 1 capsule (100 mg total) by mouth every 8 (eight) hours. 12/13/19   Lamptey, Myrene Galas, MD  sulfamethoxazole-trimethoprim (BACTRIM DS) 800-160  MG tablet Take 1 tablet by mouth 2 (two) times daily for 5 days. 12/27/19 01/01/20  Chanta Bauers, Marguerita Beards, PA-C  traMADol (ULTRAM) 50 MG tablet Take 1 tablet (50 mg total) by mouth every 6 (six) hours as needed for up to 3 days. 12/27/19 12/30/19  Reyana Leisey, Marguerita Beards, PA-C    Family History Family History  Problem Relation Age of Onset  . Stroke Maternal Grandmother   . Hypertension Mother   . Asthma Mother   . Hypertension Brother   . Hypertension Brother   . Heart attack Father   . Cancer Neg Hx     Social History Social History   Tobacco Use  . Smoking status: Never Smoker  . Smokeless tobacco: Never Used  Vaping Use  . Vaping Use: Never used  Substance Use Topics  . Alcohol use: Yes    Comment: occ  . Drug use: No     Allergies   Other, Eggs or egg-derived products, Fish allergy, and Iodine   Review of Systems Review of Systems   Physical Exam Triage Vital Signs ED Triage Vitals  Enc Vitals Group     BP 12/27/19 1705 124/75     Pulse Rate 12/27/19 1705 91     Resp 12/27/19 1705 20     Temp 12/27/19 1705 98.4 F (36.9 C)     Temp Source 12/27/19 1705 Oral     SpO2 12/27/19 1705 99 %     Weight --      Height --      Head Circumference --      Peak Flow --      Pain Score 12/27/19 1702 10     Pain Loc --      Pain Edu? --      Excl. in [place]? --    No data found.  Updated Vital Signs BP 124/75 (BP Location: Left Arm) Comment (BP Location): large cuff  Pulse 91   Temp 98.4 F (36.9 C) (Oral)   Resp 20   LMP 08/04/2016 Comment: patient states bleeding is "spotting"  SpO2 99%   Visual Acuity Right Eye Distance:   Left Eye Distance:   Bilateral Distance:    Right Eye Near:   Left Eye Near:    Bilateral Near:     Physical Exam Vitals and nursing note reviewed.  Constitutional:      General: She is not in acute distress.    Appearance: She is well-developed.  HENT:     Head: Normocephalic and atraumatic.  Eyes:     Conjunctiva/sclera: Conjunctivae  normal.    Cardiovascular:     Rate and Rhythm: Normal rate.     Heart sounds: No murmur heard.   Pulmonary:     Effort: Pulmonary effort is normal. No respiratory distress.  Musculoskeletal:     Cervical back: Neck supple.     Comments: Right hand with swelling and erythema over the thumb, extending proximally over the lateral aspect of the radius.  There is streaking along the flexor tendon sheath of the thumb.  Skin warm to touch and tender.  Patient has limited range of motion due to pain.  Cap refill is less than 2 seconds.  Area marked with skin marker.  Skin:    General: Skin is warm and dry.  Neurological:     Mental Status: She is alert.      UC Treatments / Results  Labs (all labs ordered are listed, but only abnormal results are displayed) Labs Reviewed - No data to display  EKG   Radiology No results found.  Procedures Procedures (including critical care time)  Medications Ordered in UC Medications  cefTRIAXone (ROCEPHIN) injection 1 g (1 g Intramuscular Given 12/27/19 1747)    Initial Impression / Assessment and Plan / UC Course  I have reviewed the triage vital signs and the nursing notes.  Pertinent labs & imaging results that were available during my care of the patient were reviewed by me and considered in my medical decision making (see chart for details).     #Cellulitis of thumb #Pain right thumb Patient is a 49 year old with thumb pain concerning for cellulitis.  Given abrupt onset lack of response to other medications concern for infection versus inflammation.  Case was discussed with sports medicine attending physician on shift tonight Dr. Raeford Razor, patient's thumb examined by Dr. Raeford Razor as well and agrees that we should cover for infection and have close follow-up.  Dose of Rocephin given here, Bactrim outpatient.  Tramadol for severe pain.  Patient to follow-up with Dr. Raeford Razor in the sports medicine office for reevaluation in 2 days.  Patient placed in a  thumb spica splint for support and limit movement.  Patient verbalized understanding agreement the plan of care.  Strict return precautions were discussed. Final Clinical Impressions(s) / UC Diagnoses   Final diagnoses:  Cellulitis of right thumb  Pain of right thumb     Discharge Instructions     Take the bactrim 2 times a day until follow up Use the splint  Take the tramadol as prescribed You may take tylenol in addition to this  Call the numbed supplied tomorrow to have close follow up      ED Prescriptions    Medication Sig Dispense Auth. Provider   sulfamethoxazole-trimethoprim (BACTRIM DS) 800-160 MG tablet Take 1 tablet by mouth 2 (two) times daily for 5 days. 10 tablet Yesenia Simmons, Marguerita Beards, PA-C   traMADol (ULTRAM) 50 MG tablet Take 1 tablet (50 mg total) by mouth every 6 (six) hours as needed for up to 3 days. 12 tablet Yesenia Simmons, Marguerita Beards, PA-C     I have reviewed the PDMP during this encounter.   Purnell Shoemaker, PA-C 12/28/19 0036    Sparrow [place], Marguerita Beards, PA-C 12/28/19 0037

## 2019-12-29 ENCOUNTER — Ambulatory Visit: Payer: Self-pay

## 2019-12-29 ENCOUNTER — Encounter: Payer: Self-pay | Admitting: Family Medicine

## 2019-12-29 ENCOUNTER — Ambulatory Visit (HOSPITAL_BASED_OUTPATIENT_CLINIC_OR_DEPARTMENT_OTHER)
Admission: RE | Admit: 2019-12-29 | Discharge: 2019-12-29 | Disposition: A | Discharge status: Home / Self Care | Payer: 59 | Source: Ambulatory Visit | Attending: Family Medicine | Admitting: Family Medicine

## 2019-12-29 ENCOUNTER — Ambulatory Visit (INDEPENDENT_AMBULATORY_CARE_PROVIDER_SITE_OTHER): Payer: 59 | Admitting: Family Medicine

## 2019-12-29 ENCOUNTER — Other Ambulatory Visit: Payer: Self-pay

## 2019-12-29 VITALS — BP 108/72 | HR 76 | Ht 61.0 in | Wt 188.0 lb

## 2019-12-29 DIAGNOSIS — M25531 Pain in right wrist: Secondary | ICD-10-CM

## 2019-12-29 DIAGNOSIS — G8929 Other chronic pain: Secondary | ICD-10-CM

## 2019-12-29 NOTE — Patient Instructions (Signed)
Nice to meet you  Please continue the antibiotics  Please use ice as needed  I will call with the lab results and xray results.  Please send me a message in MyChart with any questions or updates.  Please see me back in 1 week depending on your visit on Friday.   --Dr. Raeford Razor

## 2019-12-29 NOTE — Progress Notes (Signed)
Yesenia Simmons - 49 y.o. female MRN [id number]  Date of birth: May 29, 1971  SUBJECTIVE:  Including CC & ROS.  Chief Complaint  Patient presents with  . Hand Pain    right    Yesenia Simmons is a 49 y.o. female that is presenting with right hand pain.  She was seen in urgent care on 6/28.  Since that time her hand pain is gotten improved.  She was given ceftriaxone and Bactrim.  She denies any history of similar pain.  She still has trouble with moving her thumb.  The pain is severe in nature.  It is localized to the radial aspect of the wrist and thumb.   Review of Systems See HPI   HISTORY: Past Medical, Surgical, Social, and Family History Reviewed & Updated per EMR.   Pertinent Historical Findings include:  Past Medical History:  Diagnosis Date  . Arthritis   . Asthma   . CTS (carpal tunnel syndrome)   . Hypertension   . RBBB   . Sinus tachycardia     Past Surgical History:  Procedure Laterality Date  . TUBAL LIGATION      Family History  Problem Relation Age of Onset  . Stroke Maternal Grandmother   . Hypertension Mother   . Asthma Mother   . Hypertension Brother   . Hypertension Brother   . Heart attack Father   . Cancer Neg Hx     Social History   Socioeconomic History  . Marital status: Single    Spouse name: Not on file  . Number of children: 3  . Years of education: 73  . Highest education level: Not on file  Occupational History  . Occupation: in between jobs  Tobacco Use  . Smoking status: Never Smoker  . Smokeless tobacco: Never Used  Vaping Use  . Vaping Use: Never used  Substance and Sexual Activity  . Alcohol use: Yes    Comment: occ  . Drug use: No  . Sexual activity: Yes    Birth control/protection: Surgical  Other Topics Concern  . Not on file  Social History Narrative   Lives with son in a 2 story home.  Has 3 children.  Currently in between jobs.  Education: high school.     Social Determinants of Health   Financial  Resource Strain:   . Difficulty of Paying Living Expenses:   Food Insecurity:   . Worried About Charity fundraiser in the Last Year:   . Arboriculturist in the Last Year:   Transportation Needs:   . Film/video editor (Medical):   Marland Kitchen Lack of Transportation (Non-Medical):   Physical Activity:   . Days of Exercise per Week:   . Minutes of Exercise per Session:   Stress:   . Feeling of Stress :   Social Connections:   . Frequency of Communication with Friends and Family:   . Frequency of Social Gatherings with Friends and Family:   . Attends Religious Services:   . Active Member of Clubs or Organizations:   . Attends Archivist Meetings:   Marland Kitchen Marital Status:   Intimate Partner Violence:   . Fear of Current or Ex-Partner:   . Emotionally Abused:   Marland Kitchen Physically Abused:   . Sexually Abused:      PHYSICAL EXAM:  VS: BP 108/72   Pulse 76   Ht 5\' 1"  (1.549 m)   Wt 188 lb (85.3 kg)   LMP 08/04/2016 Comment:  patient states bleeding is "spotting"  BMI 35.52 kg/m  Physical Exam Gen: NAD, alert, cooperative with exam, well-appearing MSK:  Right thumb: Improved erythema from markings on her wrist. Limited wrist flexion extension. Limited thumb flexion and extension. Tenderness to palpation over the distal radius and wrist and thumb. Neurovascular intact  Limited ultrasound: Right wrist:  There is significant effusion of the first dorsal compartment.  This extends down to the insertion.  There is increased hyperemia surrounding this area as well as of the base of the thumb. There appears to be an effusion emanating from the [facility] joint with associated hyperemia. No changes associated with the distal radius dorsally. Soft tissue changes in the dorsum base of the hand. There are hyperechoic changes which could represent gout  Summary: Significant effusion and hyperemia which could represent infection versus inflammatory process.  Ultrasound and interpretation by  Clearance Coots, MD    ASSESSMENT & PLAN:   Right wrist pain She has significant effusion noted around the first dorsal compartment and into the carpal bones the base of the thumb.  She received ceftriaxone and Bactrim and her symptoms have improved during this time.  Possible that this is infectious in nature.  There is changes on ultrasound which could represent more of an inflammatory process with the hyperechoic densities which could represent gout. -Counseled on supportive care. -Continue Bactrim. -CBC, CMP, sed rate, CRP, blood culture, uric acid -Pending results may need to consider the addition of prednisone to her regimen. -Counseled on close follow-up if this is infectious in nature.

## 2019-12-29 NOTE — Assessment & Plan Note (Signed)
She has significant effusion noted around the first dorsal compartment and into the carpal bones the base of the thumb.  She received ceftriaxone and Bactrim and her symptoms have improved during this time.  Possible that this is infectious in nature.  There is changes on ultrasound which could represent more of an inflammatory process with the hyperechoic densities which could represent gout. -Counseled on supportive care. -Continue Bactrim. -CBC, CMP, sed rate, CRP, blood culture, uric acid -Pending results may need to consider the addition of prednisone to her regimen. -Counseled on close follow-up if this is infectious in nature.

## 2019-12-30 ENCOUNTER — Telehealth: Payer: Self-pay | Admitting: Family Medicine

## 2019-12-30 NOTE — Telephone Encounter (Signed)
Informed of results. Her hand continues to improve with the bactrim. Still seems that there is an inflammatory process. Could consider prednisone.   Rosemarie Ax, MD [facility] 12/30/2019, 8:16 AM

## 2019-12-31 ENCOUNTER — Encounter: Payer: Self-pay | Admitting: Orthopaedic Surgery

## 2019-12-31 ENCOUNTER — Other Ambulatory Visit (INDEPENDENT_AMBULATORY_CARE_PROVIDER_SITE_OTHER): Payer: Self-pay | Admitting: Primary Care

## 2019-12-31 ENCOUNTER — Ambulatory Visit (INDEPENDENT_AMBULATORY_CARE_PROVIDER_SITE_OTHER): Payer: 59 | Admitting: Orthopaedic Surgery

## 2019-12-31 DIAGNOSIS — Z760 Encounter for issue of repeat prescription: Secondary | ICD-10-CM

## 2019-12-31 DIAGNOSIS — M25531 Pain in right wrist: Secondary | ICD-10-CM | POA: Diagnosis not present

## 2019-12-31 DIAGNOSIS — I10 Essential (primary) hypertension: Secondary | ICD-10-CM

## 2019-12-31 MED ORDER — PREDNISONE 10 MG (21) PO TBPK
ORAL_TABLET | ORAL | 0 refills | Status: DC
Start: 2019-12-31 — End: 2024-03-22

## 2019-12-31 NOTE — Progress Notes (Signed)
Office Visit Note   Patient: Yesenia Simmons           Date of Birth: 1970-07-04           MRN: [id number] Visit Date: 12/31/2019              Requested by: Kerin Perna, NP [address] [place],  [place] [address] PCP: Kerin Perna, NP   Assessment & Plan: Visit Diagnoses:  1. Right wrist pain     Plan: Impression is right wrist pain and inflammation.  My suspicion is that she has some inflammatory process going on.  I think the suspicion for infection is lower although this was considered.  I would like to try her on a Dosepak and recheck her in 2 weeks.  Questions encouraged and answered.  Patient instructed to present to emergency room or urgent care during weekend and nonbusiness hours if she notices any worsening.  Follow-Up Instructions: Return in about 2 weeks (around 01/14/2020).   Orders:  No orders of the defined types were placed in this encounter.  Meds ordered this encounter  Medications  . predniSONE (STERAPRED UNI-PAK 21 TAB) 10 MG (21) TBPK tablet    Sig: Take as directed    Dispense:  21 tablet    Refill:  0      Procedures: No procedures performed   Clinical Data: No additional findings.   Subjective: Chief Complaint  Patient presents with  . Right Hand - Pain    [name] comes in today for evaluation of right wrist pain and swelling.  She is a referral from my: Sports medicine office.  She recently had insidious onset of right wrist swelling and redness and pain.  She had infectious labs performed that showed elevated CRP and sed rate with normal peripheral white count.  Uric acid was normal.  She was placed on Bactrim with questionable improvement.  Denies a history of gout or any recent illnesses or injuries.  Denies any constitutional symptoms.   Review of Systems  Constitutional: Negative.   HENT: Negative.   Eyes: Negative.   Respiratory: Negative.   Cardiovascular: Negative.   Endocrine: Negative.   Musculoskeletal:  Negative.   Neurological: Negative.   Hematological: Negative.   Psychiatric/Behavioral: Negative.   All other systems reviewed and are negative.    Objective: Vital Signs: LMP 08/04/2016 Comment: patient states bleeding is "spotting"  Physical Exam Vitals and nursing note reviewed.  Constitutional:      Appearance: She is well-developed.  Pulmonary:     Effort: Pulmonary effort is normal.  Skin:    General: Skin is warm.     Capillary Refill: Capillary refill takes less than 2 seconds.  Neurological:     Mental Status: She is alert and oriented to person, place, and time.  Psychiatric:        Behavior: Behavior normal.        Thought Content: Thought content normal.        Judgment: Judgment normal.     Ortho Exam Right wrist shows positive Finkelstein's.  She has mild swelling.  The redness is resolved compared to the picture she showed me.  She has some moderate pain with range of motion of the wrist.  No soft tissue crepitus. Specialty Comments:  No specialty comments available.  Imaging: No results found.   PMFS History: Patient Active Problem List   Diagnosis Date Noted  . Right wrist pain 12/29/2019  . Bilateral carpal tunnel syndrome 02/28/2017  Past Medical History:  Diagnosis Date  . Arthritis   . Asthma   . CTS (carpal tunnel syndrome)   . Hypertension   . RBBB   . Sinus tachycardia     Family History  Problem Relation Age of Onset  . Stroke Maternal Grandmother   . Hypertension Mother   . Asthma Mother   . Hypertension Brother   . Hypertension Brother   . Heart attack Father   . Cancer Neg Hx     Past Surgical History:  Procedure Laterality Date  . TUBAL LIGATION     Social History   Occupational History  . Occupation: in between jobs  Tobacco Use  . Smoking status: Never Smoker  . Smokeless tobacco: Never Used  Vaping Use  . Vaping Use: Never used  Substance and Sexual Activity  . Alcohol use: Yes    Comment: occ  . Drug  use: No  . Sexual activity: Yes    Birth control/protection: Surgical

## 2020-01-04 ENCOUNTER — Telehealth: Payer: Self-pay | Admitting: Family Medicine

## 2020-01-04 LAB — COMPREHENSIVE METABOLIC PANEL
ALT: 23 IU/L (ref 0–32)
AST: 18 IU/L (ref 0–40)
Albumin/Globulin Ratio: 1.3 (ref 1.2–2.2)
Albumin: 3.9 g/dL (ref 3.8–4.8)
Alkaline Phosphatase: 106 IU/L (ref 48–121)
BUN/Creatinine Ratio: 13 (ref 9–23)
BUN: 9 mg/dL (ref 6–24)
Bilirubin Total: 0.2 mg/dL (ref 0.0–1.2)
CO2: 24 mmol/L (ref 20–29)
Calcium: 8.8 mg/dL (ref 8.7–10.2)
Chloride: 101 mmol/L (ref 96–106)
Creatinine, Ser: 0.71 mg/dL (ref 0.57–1.00)
GFR calc Af Amer: 116 mL/min/{1.73_m2} (ref >59)
GFR calc non Af Amer: 101 mL/min/{1.73_m2} (ref >59)
Globulin, Total: 3 g/dL (ref 1.5–4.5)
Glucose: 91 mg/dL (ref 65–99)
Potassium: 3.5 mmol/L (ref 3.5–5.2)
Sodium: 140 mmol/L (ref 134–144)
Total Protein: 6.9 g/dL (ref 6.0–8.5)

## 2020-01-04 LAB — CBC WITH DIFFERENTIAL
Basophils Absolute: 0 10*3/uL (ref 0.0–0.2)
Basos: 0 %
EOS (ABSOLUTE): 0.2 10*3/uL (ref 0.0–0.4)
Eos: 3 %
Hematocrit: 34 % (ref 34.0–46.6)
Hemoglobin: 11.2 g/dL (ref 11.1–15.9)
Immature Grans (Abs): 0 10*3/uL (ref 0.0–0.1)
Immature Granulocytes: 0 %
Lymphocytes Absolute: 1.8 10*3/uL (ref 0.7–3.1)
Lymphs: 35 %
MCH: 28.4 pg (ref 26.6–33.0)
MCHC: 32.9 g/dL (ref 31.5–35.7)
MCV: 86 fL (ref 79–97)
Monocytes Absolute: 0.5 10*3/uL (ref 0.1–0.9)
Monocytes: 10 %
Neutrophils Absolute: 2.7 10*3/uL (ref 1.4–7.0)
Neutrophils: 52 %
RBC: 3.94 x10E6/uL (ref 3.77–5.28)
RDW: 14 % (ref 11.7–15.4)
WBC: 5.2 10*3/uL (ref 3.4–10.8)

## 2020-01-04 LAB — URIC ACID: Uric Acid: 2.7 mg/dL (ref 2.6–6.2)

## 2020-01-04 LAB — SEDIMENTATION RATE: Sed Rate: 85 mm/hr — ABNORMAL HIGH (ref 0–32)

## 2020-01-04 LAB — C-REACTIVE PROTEIN: CRP: 41 mg/L — ABNORMAL HIGH (ref 0–10)

## 2020-01-04 LAB — CULTURE, BLOOD (SINGLE)

## 2020-01-04 NOTE — Telephone Encounter (Signed)
Left VM for patient. If she calls back please have her speak with a nurse/CMA and inform that the blood culture didn't have any bacteria grow at this point. We will need to follow up to make sure she is improving.   If any questions then please take the best time and phone number to call and I will try to call her back.   Rosemarie Ax, MD [facility] 01/04/2020, 8:10 AM

## 2020-01-05 ENCOUNTER — Ambulatory Visit: Payer: 59 | Admitting: Family Medicine

## 2020-01-05 NOTE — Progress Notes (Deleted)
  Yesenia Simmons - 49 y.o. female MRN [id number]  Date of birth: 21-Dec-1970  SUBJECTIVE:  Including CC & ROS.  No chief complaint on file.   Yesenia Simmons is a 49 y.o. female that is  ***.  ***   Review of Systems See HPI   HISTORY: Past Medical, Surgical, Social, and Family History Reviewed & Updated per EMR.   Pertinent Historical Findings include:  Past Medical History:  Diagnosis Date  . Arthritis   . Asthma   . CTS (carpal tunnel syndrome)   . Hypertension   . RBBB   . Sinus tachycardia     Past Surgical History:  Procedure Laterality Date  . TUBAL LIGATION      Family History  Problem Relation Age of Onset  . Stroke Maternal Grandmother   . Hypertension Mother   . Asthma Mother   . Hypertension Brother   . Hypertension Brother   . Heart attack Father   . Cancer Neg Hx     Social History   Socioeconomic History  . Marital status: Single    Spouse name: Not on file  . Number of children: 3  . Years of education: 7  . Highest education level: Not on file  Occupational History  . Occupation: in between jobs  Tobacco Use  . Smoking status: Never Smoker  . Smokeless tobacco: Never Used  Vaping Use  . Vaping Use: Never used  Substance and Sexual Activity  . Alcohol use: Yes    Comment: occ  . Drug use: No  . Sexual activity: Yes    Birth control/protection: Surgical  Other Topics Concern  . Not on file  Social History Narrative   Lives with son in a 2 story home.  Has 3 children.  Currently in between jobs.  Education: high school.     Social Determinants of Health   Financial Resource Strain:   . Difficulty of Paying Living Expenses:   Food Insecurity:   . Worried About Charity fundraiser in the Last Year:   . Arboriculturist in the Last Year:   Transportation Needs:   . Film/video editor (Medical):   Marland Kitchen Lack of Transportation (Non-Medical):   Physical Activity:   . Days of Exercise per Week:   . Minutes of Exercise per  Session:   Stress:   . Feeling of Stress :   Social Connections:   . Frequency of Communication with Friends and Family:   . Frequency of Social Gatherings with Friends and Family:   . Attends Religious Services:   . Active Member of Clubs or Organizations:   . Attends Archivist Meetings:   Marland Kitchen Marital Status:   Intimate Partner Violence:   . Fear of Current or Ex-Partner:   . Emotionally Abused:   Marland Kitchen Physically Abused:   . Sexually Abused:      PHYSICAL EXAM:  VS: LMP 08/04/2016 Comment: patient states bleeding is "spotting" Physical Exam Gen: NAD, alert, cooperative with exam, well-appearing MSK:  ***      ASSESSMENT & PLAN:   No problem-specific Assessment & Plan notes found for this encounter.

## 2020-01-14 ENCOUNTER — Encounter: Payer: Self-pay | Admitting: Orthopaedic Surgery

## 2020-01-14 ENCOUNTER — Ambulatory Visit (INDEPENDENT_AMBULATORY_CARE_PROVIDER_SITE_OTHER): Payer: 59 | Admitting: Orthopaedic Surgery

## 2020-01-14 VITALS — Ht 61.0 in | Wt 188.4 lb

## 2020-01-14 DIAGNOSIS — G5601 Carpal tunnel syndrome, right upper limb: Secondary | ICD-10-CM | POA: Diagnosis not present

## 2020-01-14 NOTE — Progress Notes (Signed)
   Office Visit Note   Patient: GLADYS DECKARD           Date of Birth: 10/24/1970           MRN: [id number] Visit Date: 01/14/2020              Requested by: Kerin Perna, NP [address] [place],  [place] [address] PCP: Kerin Perna, NP   Assessment & Plan: Visit Diagnoses:  1. Right carpal tunnel syndrome     Plan: Impression is right carpal tunnel syndrome.  Her clinical symptoms have gotten worse in the last few years.  At this point after consideration of her options she would like to proceed with carpal tunnel release.  Risk benefits rehab recovery reviewed with the patient in detail.  We will call the patient in the near future to schedule surgery.  Follow-Up Instructions: Return if symptoms worsen or fail to improve.   Orders:  No orders of the defined types were placed in this encounter.  No orders of the defined types were placed in this encounter.     Procedures: No procedures performed   Clinical Data: No additional findings.   Subjective: Chief Complaint  Patient presents with  . Right Wrist - Pain    Ms. Whistler returns today for right wrist pain.  She is doing little bit better since she finished her prednisone Dosepak.  She has known moderate to severe right carpal tunnel syndrome based on nerve conduction studies in 2018.  She is interested in having surgical release at this point.  No changes reported otherwise.   Review of Systems   Objective: Vital Signs: Ht 5\' 1"  (1.549 m)   Wt 188 lb 6.4 oz (85.5 kg)   LMP 08/04/2016 Comment: patient states bleeding is "spotting"  BMI 35.60 kg/m   Physical Exam  Ortho Exam Right wrist exam shows positive carpal tunnel compressive signs. Specialty Comments:  No specialty comments available.  Imaging: No results found.   PMFS History: Patient Active Problem List   Diagnosis Date Noted  . Right wrist pain 12/29/2019  . Bilateral carpal tunnel syndrome 02/28/2017   Past  Medical History:  Diagnosis Date  . Arthritis   . Asthma   . CTS (carpal tunnel syndrome)   . Hypertension   . RBBB   . Sinus tachycardia     Family History  Problem Relation Age of Onset  . Stroke Maternal Grandmother   . Hypertension Mother   . Asthma Mother   . Hypertension Brother   . Hypertension Brother   . Heart attack Father   . Cancer Neg Hx     Past Surgical History:  Procedure Laterality Date  . TUBAL LIGATION     Social History   Occupational History  . Occupation: in between jobs  Tobacco Use  . Smoking status: Never Smoker  . Smokeless tobacco: Never Used  Vaping Use  . Vaping Use: Never used  Substance and Sexual Activity  . Alcohol use: Yes    Comment: occ  . Drug use: No  . Sexual activity: Yes    Birth control/protection: Surgical

## 2020-02-02 ENCOUNTER — Other Ambulatory Visit (INDEPENDENT_AMBULATORY_CARE_PROVIDER_SITE_OTHER): Payer: Self-pay | Admitting: Primary Care

## 2020-02-02 DIAGNOSIS — I10 Essential (primary) hypertension: Secondary | ICD-10-CM

## 2020-02-02 DIAGNOSIS — Z760 Encounter for issue of repeat prescription: Secondary | ICD-10-CM

## 2020-02-02 MED ORDER — GABAPENTIN 300 MG PO CAPS: 300.0000 mg | ORAL_CAPSULE | Freq: Three times a day (TID) | ORAL | 3 refills | Status: DC

## 2020-02-02 MED ORDER — LOSARTAN POTASSIUM-HCTZ 100-25 MG PO TABS: 1.0000 | ORAL_TABLET | ORAL | 1 refills | Status: DC

## 2020-02-08 ENCOUNTER — Telehealth: Payer: Self-pay

## 2020-02-08 NOTE — Telephone Encounter (Signed)
Attempted to contact patient regarding mammography scholarship application. Left message on voicemail requesting patient to return call.

## 2020-02-21 ENCOUNTER — Other Ambulatory Visit: Payer: Self-pay

## 2020-02-21 ENCOUNTER — Emergency Department (HOSPITAL_BASED_OUTPATIENT_CLINIC_OR_DEPARTMENT_OTHER)
Admission: EM | Admit: 2020-02-21 | Discharge: 2020-02-21 | Disposition: A | Discharge status: Home / Self Care | Payer: 59 | Attending: Emergency Medicine | Admitting: Emergency Medicine

## 2020-02-21 ENCOUNTER — Encounter (HOSPITAL_BASED_OUTPATIENT_CLINIC_OR_DEPARTMENT_OTHER): Payer: Self-pay | Admitting: *Deleted

## 2020-02-21 DIAGNOSIS — Z79899 Other long term (current) drug therapy: Secondary | ICD-10-CM | POA: Insufficient documentation

## 2020-02-21 DIAGNOSIS — I10 Essential (primary) hypertension: Secondary | ICD-10-CM | POA: Diagnosis not present

## 2020-02-21 DIAGNOSIS — J45909 Unspecified asthma, uncomplicated: Secondary | ICD-10-CM | POA: Insufficient documentation

## 2020-02-21 DIAGNOSIS — Z20822 Contact with and (suspected) exposure to covid-19: Secondary | ICD-10-CM | POA: Diagnosis present

## 2020-02-21 LAB — SARS CORONAVIRUS 2 BY RT PCR (HOSPITAL ORDER, PERFORMED IN ~~LOC~~ HOSPITAL LAB): SARS Coronavirus 2: NEGATIVE

## 2020-02-21 MED ORDER — DEXAMETHASONE 6 MG PO TABS
6.0000 mg | ORAL_TABLET | Freq: Once | ORAL | Status: AC
  Administered 2020-02-21: 6 mg via ORAL
  Filled 2020-02-21: qty 1

## 2020-02-21 MED ORDER — ALBUTEROL SULFATE HFA 108 (90 BASE) MCG/ACT IN AERS
1.0000 | INHALATION_SPRAY | Freq: Once | RESPIRATORY_TRACT | Status: AC
  Administered 2020-02-21: 1 via RESPIRATORY_TRACT
  Filled 2020-02-21: qty 6.7

## 2020-02-21 MED ORDER — ALBUTEROL SULFATE (5 MG/ML) 0.5% IN NEBU: 2.5000 mg | INHALATION_SOLUTION | Freq: Four times a day (QID) | RESPIRATORY_TRACT | 12 refills | Status: AC | PRN

## 2020-02-21 NOTE — ED Provider Notes (Signed)
[facility] EMERGENCY DEPARTMENT Provider Note   CSN: [id number] Arrival date & time: 02/21/20  1105     History Chief Complaint  Patient presents with  . Covid Exposure    ALISSE TUITE is a 49 y.o. female with past medical history significant for arthritis, asthma, carpal tunnel syndrome, hypertension, right bundle branch block, tachycardia.  She did not have Covid vaccinations.  HPI Patient presents to emergency department today with chief complaint of Covid exposure x1 day ago.  Patient states she was at a funeral.  The funeral was outside and she wore her mask and was socially distance from the other attendees however this morning when she woke up she heard her cousin who lives with her coughing and wanted to be tested for Covid. Her cousin is Covid positive.  Patient herself is endorsing rhinorrhea, headache and body aches x 2 days.  Triage note states patient has also had diarrhea cough and abdominal pain.  She denies the symptoms to me.  Has not take any medications for symptoms prior to arrival.  She denies fever, chills, chest pain, shortness of breath, nausea, emesis, urinary symptoms, rash.  Denies loss of sense of taste or smell.    Past Medical History:  Diagnosis Date  . Arthritis   . Asthma   . CTS (carpal tunnel syndrome)   . Hypertension   . RBBB   . Sinus tachycardia     Patient Active Problem List   Diagnosis Date Noted  . Right wrist pain 12/29/2019  . Bilateral carpal tunnel syndrome 02/28/2017    Past Surgical History:  Procedure Laterality Date  . TUBAL LIGATION       OB History    Gravida  5   Para  3   Term  2   Preterm  1   AB  2   Living  3     SAB  2   TAB      Ectopic      Multiple      Live Births  3           Family History  Problem Relation Age of Onset  . Stroke Maternal Grandmother   . Hypertension Mother   . Asthma Mother   . Hypertension Brother   . Hypertension Brother   . Heart attack  Father   . Cancer Neg Hx     Social History   Tobacco Use  . Smoking status: Never Smoker  . Smokeless tobacco: Never Used  Vaping Use  . Vaping Use: Never used  Substance Use Topics  . Alcohol use: Yes    Comment: occ  . Drug use: No    Home Medications Prior to Admission medications   Medication Sig Start Date End Date Taking? Authorizing Provider  albuterol (PROVENTIL) (5 MG/ML) 0.5% nebulizer solution Take 0.5 mLs (2.5 mg total) by nebulization every 6 (six) hours as needed for wheezing or shortness of breath. 02/21/20   Josephina Melcher E, PA-C  albuterol (VENTOLIN HFA) 108 (90 Base) MCG/ACT inhaler Inhale 2 puffs into the lungs every 4 (four) hours as needed for wheezing or shortness of breath (or coughing). 9/62/22   Delora Fuel, MD  amLODipine (NORVASC) 10 MG tablet TAKE 1 TABLET BY MOUTH EVERY DAY 12/31/19   Kerin Perna, NP  aspirin EC 81 MG tablet Take 1 tablet (81 mg total) by mouth daily. 04/01/17   Clent Demark, PA-C  benzonatate (TESSALON) 100 MG capsule Take 1 capsule (  100 mg total) by mouth every 8 (eight) hours. 12/13/19   Chase Picket, MD  budesonide-formoterol (SYMBICORT) 160-4.5 MCG/ACT inhaler Inhale 2 puffs into the lungs 2 (two) times daily. 11/28/19   Kerin Perna, NP  butalbital-aspirin-caffeine [facility]) [phone] MG capsule TAKE 1 CAPSULE BY MOUTH EVERY 6 HOURS AS NEEDED FOR HEADACHE 12/03/19   Kerin Perna, NP  Calcium Carbonate-Vit D-Min (CALTRATE 600+D PLUS MINERALS) 600-800 MG-UNIT TABS Take 2 tablets by mouth daily. 07/15/19   Kerin Perna, NP  Cholecalciferol (VITAMIN D3 MAXIMUM STRENGTH PO) Take 1 tablet by mouth daily at 3 pm. (1600)    [provider]  fluticasone (FLONASE) 50 MCG/ACT nasal spray Place 2 sprays into both nostrils daily. 11/16/19   Kerin Perna, NP  gabapentin (NEURONTIN) 300 MG capsule Take 1 capsule (300 mg total) by mouth 3 (three) times daily. 02/02/20   Kerin Perna, NP    losartan-hydrochlorothiazide (HYZAAR) 100-25 MG tablet Take 1 tablet by mouth every morning. 02/02/20   Kerin Perna, NP  megestrol (MEGACE) 40 MG tablet Take 1 tablet (40 mg total) by mouth 2 (two) times daily. Can increase to two tablets twice a day in the event of heavy bleeding 03/29/19   Lavonia Drafts, MD  Melatonin 5 MG TABS Take 1 tablet (5 mg total) by mouth at bedtime as needed. 07/15/19   Kerin Perna, NP  montelukast (SINGULAIR) 10 MG tablet TAKE 1 TABLET BY MOUTH EVERY AFTERNOON 11/01/19   Kerin Perna, NP  predniSONE (STERAPRED UNI-PAK 21 TAB) 10 MG (21) TBPK tablet Take as directed 12/31/19   Leandrew Koyanagi, MD  Vitamin D, Ergocalciferol, (DRISDOL) 1.25 MG (50000 UT) CAPS capsule Take 1 capsule (50,000 Units total) by mouth every 7 (seven) days. 11/29/18   Kerin Perna, NP    Allergies    Other, Eggs or egg-derived products, Fish allergy, and Iodine  Review of Systems   Review of Systems  All other systems are reviewed and are negative for acute change except as noted in the HPI.   Physical Exam Updated Vital Signs BP 131/88   Pulse 76   Temp 98.5 F (36.9 C) (Oral)   Resp 16   Ht 5\' 1"  (1.549 m)   Wt 85.3 kg   LMP 08/04/2016 Comment: patient states bleeding is "spotting"  SpO2 100%   BMI 35.52 kg/m   Physical Exam Vitals and nursing note reviewed.  Constitutional:      General: She is not in acute distress.    Appearance: She is not ill-appearing.  HENT:     Head: Normocephalic and atraumatic.     Comments: No sinus or temporal tenderness.    Right Ear: Tympanic membrane and external ear normal.     Left Ear: Tympanic membrane and external ear normal.     Nose: Rhinorrhea present.     Mouth/Throat:     Mouth: Mucous membranes are moist.     Pharynx: Oropharynx is clear.  Eyes:     General: No scleral icterus.       Right eye: No discharge.        Left eye: No discharge.     Extraocular Movements: Extraocular movements  intact.     Conjunctiva/sclera: Conjunctivae normal.     Pupils: Pupils are equal, round, and reactive to light.  Neck:     Vascular: No JVD.  Cardiovascular:     Rate and Rhythm: Normal rate and regular rhythm.  Pulses: Normal pulses.          Radial pulses are 2+ on the right side and 2+ on the left side.     Heart sounds: Normal heart sounds.  Pulmonary:     Comments: Lungs clear to auscultation in all fields. Symmetric chest rise. No wheezing, rales, or rhonchi.  Oxygen saturation is 100 % on room air. Abdominal:     General: Bowel sounds are normal.     Tenderness: There is no right CVA tenderness or left CVA tenderness.     Comments: Abdomen is soft, non-distended, and non-tender in all quadrants. No rigidity, no guarding. No peritoneal signs.  Musculoskeletal:        General: Normal range of motion.     Cervical back: Normal range of motion.  Skin:    General: Skin is warm and dry.     Capillary Refill: Capillary refill takes less than 2 seconds.  Neurological:     Mental Status: She is oriented to person, place, and time.     GCS: GCS eye subscore is 4. GCS verbal subscore is 5. GCS motor subscore is 6.     Comments: Fluent speech, no facial droop.  CN 2-12 grossly intact. Equal strength in bilateral upper and lower extremities. Ambulates with normal gait.   Psychiatric:        Behavior: Behavior normal.     ED Results / Procedures / Treatments   Labs (all labs ordered are listed, but only abnormal results are displayed) Labs Reviewed  SARS CORONAVIRUS 2 BY RT PCR (HOSPITAL ORDER, [place] [facility])    EKG None  Radiology No results found.  Procedures Procedures (including critical care time)  Medications Ordered in ED Medications  dexamethasone (DECADRON) tablet 6 mg (6 mg Oral Given 02/21/20 1525)  albuterol (VENTOLIN HFA) 108 (90 Base) MCG/ACT inhaler 1 puff (1 puff Inhalation Given 02/21/20 1520)    ED Course  I have reviewed  the triage vital signs and the nursing notes.  Pertinent labs & imaging results that were available during my care of the patient were reviewed by me and considered in my medical decision making (see chart for details). Vitals:   02/21/20 1137 02/21/20 1140 02/21/20 1527  BP:  136/73 131/88  Pulse:  60 76  Resp:  20 16  Temp:  98.5 F (36.9 C)   TempSrc:  Oral   SpO2:  99% 100%  Weight: 85.3 kg    Height: 5\' 1"  (1.549 m)         MDM Rules/Calculators/A&P                          History provided by patient with additional history obtained from chart review.     Patient seen and examined. Patient presents awake, alert, hemodynamically stable, afebrile, non toxic.  She is well-appearing.  Covid swab collected in triage is negative.   On exam her lungs are clear to auscultation in all fields.  She does have rhinorrhea.  No sinus tenderness.  Neuro exam normal.  No focal weakness.  Discussed with patient that although her test result is negative today it is possible she does in fact have Covid given her close exposure. Symptomatic treatment discussed.  Given her reassuring exam do not feel further emergent work-up is needed.  Discussed this with patient and she is agreeable with plan of care.  She requested prescription for a new nebulizer machine as  hers is broken and for the nebulizer solution. The patient appears reasonably screened and/or stabilized for discharge and I doubt any other medical condition or other [facility] requiring further screening, evaluation, or treatment in the ED at this time prior to discharge. The patient is safe for discharge with strict return precautions discussed. Recommend pcp follow up.  ROSELANI GRAJEDA was evaluated in Emergency Department on 02/21/2020 for the symptoms described in the history of present illness. She was evaluated in the context of the global COVID-19 pandemic, which necessitated consideration that the patient might be at risk for infection with  the SARS-CoV-2 virus that causes COVID-19. Institutional protocols and algorithms that pertain to the evaluation of patients at risk for COVID-19 are in a state of rapid change based on information released by regulatory bodies including the CDC and federal and state organizations. These policies and algorithms were followed during the patient's care in the ED.   Portions of this note were generated with [employer]. Dictation errors may occur despite best attempts at proofreading.    Final Clinical Impression(s) / ED Diagnoses Final diagnoses:  Exposure to COVID-19 virus    Rx / DC Orders ED Discharge Orders         Ordered    For home use only DME Nebulizer machine        02/21/20 1448    albuterol (PROVENTIL) (5 MG/ML) 0.5% nebulizer solution  Every 6 hours PRN        02/21/20 1448           Keyle Doby, Harley Hallmark, PA-C 02/21/20 1558    Gareth Morgan, MD 02/22/20 2333

## 2020-02-21 NOTE — Discharge Instructions (Signed)
You were seen in the ED for **.  I suspect you have a virus. We tested your for COVID-19 (coronavirus) infection.  It is also possible you could have other viral upper respiratory infection from another virus.    Test results come back in 48 hours, sometimes sooner.  Someone will call you if ypu are positive for COVID. If the result is negative you can see it on your MyChart.  Treatment of your illness and symptoms will include self-isolation, monitoring of symptoms and supportive care with over-the-counter medicines.    Return to the ED if there is increased work of breathing, shortness of breath, inability to tolerate fluids, weakness, chest pain.  If your test results are POSITIVE, the following isolation requirements need to be met to return to work and resume essential activities: At least 10 days since symptom onset  72 hours of absence of fever without antifever medicine (ibuprofen, acetaminophen). A fever is temperature of 100.37F or greater. Improvement of respiratory symptoms  If your test is NEGATIVE, you may return to work and essential activities as long as your symptoms have improved and you do not have a fever for a total of 3 days.  Call your job and notify them that your test result was negative to see if they will allow you to return to work.   Stay well-hydrated. Rest. You can use over the counter medications to help with symptoms: 600 mg ibuprofen (motrin, aleve, advil) or acetaminophen (tylenol) every 6 hours, around the clock to help with associated fevers, sore throat, headaches, generalized body aches and malaise.  Oxymetazoline (afrin) intranasal spray once daily for no more than 3 days to help with congestion, after 3 days you can switch to another over-the-counter nasal steroid spray such as fluticasone (flonase) Allergy medication (loratadine, cetirizine, etc) and phenylephrine (sudafed) help with nasal congestion, runny nose and postnasal drip.   Dextromethorphan  (Delsym) to suppress dry cough. Frequent coughing is likely causing your chest wall pain Wash your hands often to prevent spread.    Infection Prevention Recommendations for Individuals Confirmed to have, or Being Evaluated for, or have symptoms of 2019 Novel Coronavirus (COVID-19) Infection Who Receive Care at Home  Individuals who are confirmed to have, or are being evaluated for, COVID-19 should follow the prevention steps below until a healthcare provider or local or state health department says they can return to normal activities.  Stay home except to get medical care You should restrict activities outside your home, except for getting medical care. Do not go to work, school, or public areas, and do not use public transportation or taxis.  Call ahead before visiting your doctor Before your medical appointment, call the healthcare provider and tell them that you have, or are being evaluated for, COVID-19 infection. This will help the healthcare provider's office take steps to keep other people from getting infected. Ask your healthcare provider to call the local or state health department.  Monitor your symptoms Seek prompt medical attention if your illness is worsening (e.g., difficulty breathing). Before going to your medical appointment, call the healthcare provider and tell them that you have, or are being evaluated for, COVID-19 infection. Ask your healthcare provider to call the local or state health department.  Wear a facemask You should wear a facemask that covers your nose and mouth when you are in the same room with other people and when you visit a healthcare provider. People who live with or visit you should also wear a facemask  while they are in the same room with you.  Separate yourself from other people in your home As much as possible, you should stay in a different room from other people in your home. Also, you should use a separate bathroom, if available.  Avoid  sharing household items You should not share dishes, drinking glasses, cups, eating utensils, towels, bedding, or other items with other people in your home. After using these items, you should wash them thoroughly with soap and water.  Cover your coughs and sneezes Cover your mouth and nose with a tissue when you cough or sneeze, or you can cough or sneeze into your sleeve. Throw used tissues in a lined trash can, and immediately wash your hands with soap and water for at least 20 seconds or use an alcohol-based hand rub.  Wash your hands Wash your hands often and thoroughly with soap and water for at least 20 seconds. You can use an alcohol-based hand sanitizer if soap and water are not available and if your hands are not visibly dirty. Avoid touching your eyes, nose, and mouth with unwashed hands.   Prevention Steps for Caregivers and Household Members of Individuals Confirmed to have, or Being Evaluated for, or have symptoms of 2019 Novel Coronavirus (COVID-19) Infection Being Cared for in the Home  If you live with, or provide care at home for, a person confirmed to have, or being evaluated for, COVID-19 infection please follow these guidelines to prevent infection:  Follow healthcare provider's instructions Make sure that you understand and can help the patient follow any healthcare provider instructions for all care.  Provide for the patient's basic needs You should help the patient with basic needs in the home and provide support for getting groceries, prescriptions, and other personal needs.  Monitor the patient's symptoms If they are getting sicker, call his or her medical provider and tell them that the patient has, or is being evaluated for, COVID-19 infection. This will help the healthcare provider's office take steps to keep other people from getting infected. Ask the healthcare provider to call the local or state health department.  Limit the number of people who have contact  with the patient If possible, have only one caregiver for the patient. Other household members should stay in another home or place of residence. If this is not possible, they should stay in another room, or be separated from the patient as much as possible. Use a separate bathroom, if available. Restrict visitors who do not have an essential need to be in the home.  Keep older adults, very young children, and other sick people away from the patient Keep older adults, very young children, and those who have compromised immune systems or chronic health conditions away from the patient. This includes people with chronic heart, lung, or kidney conditions, diabetes, and cancer.  Ensure good ventilation Make sure that shared spaces in the home have good air flow, such as from an air conditioner or an opened window, weather permitting.  Wash your hands often Wash your hands often and thoroughly with soap and water for at least 20 seconds. You can use an alcohol based hand sanitizer if soap and water are not available and if your hands are not visibly dirty. Avoid touching your eyes, nose, and mouth with unwashed hands. Use disposable paper towels to dry your hands. If not available, use dedicated cloth towels and replace them when they become wet.  Wear a facemask and gloves Wear a disposable facemask   at all times in the room and gloves when you touch or have contact with the patient's blood, body fluids, and/or secretions or excretions, such as sweat, saliva, sputum, nasal mucus, vomit, urine, or feces.  Ensure the mask fits over your nose and mouth tightly, and do not touch it during use. Throw out disposable facemasks and gloves after using them. Do not reuse. Wash your hands immediately after removing your facemask and gloves. If your personal clothing becomes contaminated, carefully remove clothing and launder. Wash your hands after handling contaminated clothing. Place all used disposable  facemasks, gloves, and other waste in a lined container before disposing them with other household waste. Remove gloves and wash your hands immediately after handling these items.  Do not share dishes, glasses, or other household items with the patient Avoid sharing household items. You should not share dishes, drinking glasses, cups, eating utensils, towels, bedding, or other items with a patient who is confirmed to have, or being evaluated for, COVID-19 infection. After the person uses these items, you should wash them thoroughly with soap and water.  Wash laundry thoroughly Immediately remove and wash clothes or bedding that have blood, body fluids, and/or secretions or excretions, such as sweat, saliva, sputum, nasal mucus, vomit, urine, or feces, on them. Wear gloves when handling laundry from the patient. Read and follow directions on labels of laundry or clothing items and detergent. In general, wash and dry with the warmest temperatures recommended on the label.  Clean all areas the individual has used often Clean all touchable surfaces, such as counters, tabletops, doorknobs, bathroom fixtures, toilets, phones, keyboards, tablets, and bedside tables, every day. Also, clean any surfaces that may have blood, body fluids, and/or secretions or excretions on them. Wear gloves when cleaning surfaces the patient has come in contact with. Use a diluted bleach solution (e.g., dilute bleach with 1 part bleach and 10 parts water) or a household disinfectant with a label that says EPA-registered for coronaviruses. To make a bleach solution at home, add 1 tablespoon of bleach to 1 quart (4 cups) of water. For a larger supply, add  cup of bleach to 1 gallon (16 cups) of water. Read labels of cleaning products and follow recommendations provided on product labels. Labels contain instructions for safe and effective use of the cleaning product including precautions you should take when applying the product,  such as wearing gloves or eye protection and making sure you have good ventilation during use of the product. Remove gloves and wash hands immediately after cleaning.  Monitor yourself for signs and symptoms of illness Caregivers and household members are considered close contacts, should monitor their health, and will be asked to limit movement outside of the home to the extent possible. Follow the monitoring steps for close contacts listed on the symptom monitoring form.  ? If you have additional questions, contact your local health department or call the epidemiologist on call at [phone] (available 24/7). ? This guidance is subject to change. For the most up-to-date guidance from [facility], please refer to their website: YouBlogs.pl

## 2020-02-21 NOTE — ED Notes (Signed)
Pt. Reports she has had a few Covid Symptoms and her cousin who lives with her has tested positive for COVID 19 today,   Pt. Is very nervous and needs to know how to deal with the situation at home she states.

## 2020-02-21 NOTE — ED Triage Notes (Signed)
Runny nose, diarrhea, cough, abdominal pain, body aches, headache x 2 days. She was exposed to covid at a funeral.

## 2020-03-21 ENCOUNTER — Telehealth (INDEPENDENT_AMBULATORY_CARE_PROVIDER_SITE_OTHER): Payer: 59 | Admitting: Lactation Services

## 2020-03-21 DIAGNOSIS — N939 Abnormal uterine and vaginal bleeding, unspecified: Secondary | ICD-10-CM

## 2020-03-21 NOTE — Telephone Encounter (Signed)
[facility] called to request refills on Megace for patient. Routed to Dr. Ihor Dow

## 2020-03-22 ENCOUNTER — Other Ambulatory Visit: Payer: Self-pay | Admitting: Obstetrics and Gynecology

## 2020-03-28 NOTE — Telephone Encounter (Signed)
Called and spoke with patient to let her know that her Megace has been refilled but that she needs to follow up with Dr. Ihor Dow in the [facility]. Patient advised to call the [facility] to schedule. Patient voiced understanding

## 2020-05-03 ENCOUNTER — Other Ambulatory Visit (INDEPENDENT_AMBULATORY_CARE_PROVIDER_SITE_OTHER): Payer: Self-pay | Admitting: Primary Care

## 2020-05-03 DIAGNOSIS — I10 Essential (primary) hypertension: Secondary | ICD-10-CM

## 2020-05-03 DIAGNOSIS — Z760 Encounter for issue of repeat prescription: Secondary | ICD-10-CM

## 2020-05-03 MED ORDER — AMLODIPINE BESYLATE 10 MG PO TABS: 10.0000 mg | ORAL_TABLET | Freq: Every day | ORAL | 0 refills | Status: DC

## 2020-05-03 MED ORDER — LOSARTAN POTASSIUM-HCTZ 100-25 MG PO TABS: 1.0000 | ORAL_TABLET | ORAL | 0 refills | Status: AC

## 2020-06-06 ENCOUNTER — Other Ambulatory Visit (INDEPENDENT_AMBULATORY_CARE_PROVIDER_SITE_OTHER): Payer: Self-pay | Admitting: Primary Care

## 2020-07-18 ENCOUNTER — Encounter (INDEPENDENT_AMBULATORY_CARE_PROVIDER_SITE_OTHER): Payer: 59 | Admitting: Primary Care

## 2020-07-18 ENCOUNTER — Telehealth (INDEPENDENT_AMBULATORY_CARE_PROVIDER_SITE_OTHER): Payer: Self-pay

## 2020-07-18 NOTE — Telephone Encounter (Signed)
Copied from [place] [id number]. Topic: General - Other >> Jul 18, 2020  3:23 PM Leward Quan A wrote: Reason for CRM: Patient called in to speak to Ms Oletta Lamas stated that she was supposed to have a virtual visit in the morning but no one called her. She is out of town taking care of her mom who is very ill and need a refill on her medication. Asking for a call back at Ph# [phone]. And will need refills sent to [place]. [place]

## 2020-07-19 ENCOUNTER — Telehealth (INDEPENDENT_AMBULATORY_CARE_PROVIDER_SITE_OTHER): Payer: Self-pay

## 2020-07-19 MED ORDER — BUDESONIDE-FORMOTEROL FUMARATE 160-4.5 MCG/ACT IN AERO: 2.0000 | INHALATION_SPRAY | Freq: Two times a day (BID) | RESPIRATORY_TRACT | 3 refills | Status: DC

## 2020-07-19 MED ORDER — FLUCONAZOLE 150 MG PO TABS: 150.0000 mg | ORAL_TABLET | Freq: Once | ORAL | 0 refills | Status: AC

## 2020-07-19 MED ORDER — MONTELUKAST SODIUM 10 MG PO TABS: ORAL_TABLET | ORAL | 0 refills | Status: DC

## 2020-07-19 MED ORDER — GABAPENTIN 300 MG PO CAPS: 300.0000 mg | ORAL_CAPSULE | Freq: Three times a day (TID) | ORAL | 3 refills | Status: AC

## 2020-07-19 NOTE — Telephone Encounter (Signed)
Copied from [place] [id number]. Topic: General - Other >> Jul 18, 2020  3:23 PM Leward Quan A wrote: Reason for CRM: Patient called in to speak to Ms Oletta Lamas stated that she was supposed to have a virtual visit in the morning but no one called her. She is out of town taking care of her mom who is very ill and need a refill on her medication. Asking for a call back at Ph# [phone]. And will need refills sent to [place]. [place] >> Jul 19, 2020  3:26 PM Keene Breath wrote: Patient is calling because she had a virtual appt. On 01/18 and no one ever called her.  She stated that she needs refills for her medication and was waiting for the call from the doctor.  Please advise and call patient to explain why she did not get the call.  CB# [phone]

## 2020-07-24 ENCOUNTER — Encounter (INDEPENDENT_AMBULATORY_CARE_PROVIDER_SITE_OTHER): Payer: Self-pay | Admitting: Primary Care

## 2020-10-23 ENCOUNTER — Other Ambulatory Visit (INDEPENDENT_AMBULATORY_CARE_PROVIDER_SITE_OTHER): Payer: Self-pay | Admitting: Primary Care

## 2020-10-23 DIAGNOSIS — Z760 Encounter for issue of repeat prescription: Secondary | ICD-10-CM

## 2020-10-23 DIAGNOSIS — J45909 Unspecified asthma, uncomplicated: Secondary | ICD-10-CM

## 2020-10-23 NOTE — Telephone Encounter (Signed)
Requested medication (s) are due for refill today: yes  Requested medication (s) are on the active medication list: yes  Last refill:  05/03/20 #30 0 refills  Future visit scheduled: no  Notes to clinic:  no future visit. Attempted to contact patient to schedule appt. No answer, unable to leave message mailbox full. Do you want 2nd courtesy refill ?     Requested Prescriptions  Pending Prescriptions Disp Refills   amLODipine (NORVASC) 10 MG tablet [Pharmacy Med Name: amlodipine 10 mg tablet] 30 tablet 0    Sig: Take 1 tablet by mouth daily. No refills until seen      Cardiovascular:  Calcium Channel Blockers Failed - 10/23/2020  3:51 PM      Failed - Valid encounter within last 6 months    Recent Outpatient Visits           3 months ago Vaginal discharge   [place], Michelle P, NP   11 months ago Allergy, initial encounter   [place], [place], NP   1 year ago Bilateral carpal tunnel syndrome   [place] Kerin Perna, NP   1 year ago Cervical cancer screening   [place] Kerin Perna, NP   1 year ago Moderate persistent asthma without complication   [facility] Kerin Perna, NP                Passed - Last BP in normal range    BP Readings from Last 1 Encounters:  02/21/20 131/88

## 2020-10-27 NOTE — Telephone Encounter (Signed)
Needs appt

## 2020-10-30 ENCOUNTER — Other Ambulatory Visit (INDEPENDENT_AMBULATORY_CARE_PROVIDER_SITE_OTHER): Payer: Self-pay | Admitting: Primary Care

## 2020-10-30 DIAGNOSIS — J45909 Unspecified asthma, uncomplicated: Secondary | ICD-10-CM

## 2020-12-07 ENCOUNTER — Ambulatory Visit (INDEPENDENT_AMBULATORY_CARE_PROVIDER_SITE_OTHER): Payer: 59 | Admitting: Primary Care

## 2020-12-31 NOTE — Progress Notes (Signed)
err

## 2021-04-06 ENCOUNTER — Ambulatory Visit: Payer: 59 | Admitting: Orthopedic Surgery

## 2021-04-10 ENCOUNTER — Ambulatory Visit: Payer: 59 | Admitting: Orthopedic Surgery

## 2021-05-08 ENCOUNTER — Telehealth (INDEPENDENT_AMBULATORY_CARE_PROVIDER_SITE_OTHER): Payer: 59 | Admitting: Primary Care

## 2021-05-25 ENCOUNTER — Encounter (HOSPITAL_COMMUNITY): Payer: Self-pay | Admitting: Emergency Medicine

## 2021-05-25 ENCOUNTER — Emergency Department (HOSPITAL_COMMUNITY)
Admission: EM | Admit: 2021-05-25 | Discharge: 2021-05-25 | Disposition: A | Discharge status: Home / Self Care | Payer: 59 | Attending: Emergency Medicine | Admitting: Emergency Medicine

## 2021-05-25 DIAGNOSIS — J45909 Unspecified asthma, uncomplicated: Secondary | ICD-10-CM | POA: Insufficient documentation

## 2021-05-25 DIAGNOSIS — Z7982 Long term (current) use of aspirin: Secondary | ICD-10-CM | POA: Insufficient documentation

## 2021-05-25 DIAGNOSIS — L0291 Cutaneous abscess, unspecified: Secondary | ICD-10-CM

## 2021-05-25 DIAGNOSIS — Z79899 Other long term (current) drug therapy: Secondary | ICD-10-CM | POA: Insufficient documentation

## 2021-05-25 DIAGNOSIS — I10 Essential (primary) hypertension: Secondary | ICD-10-CM | POA: Insufficient documentation

## 2021-05-25 DIAGNOSIS — L0501 Pilonidal cyst with abscess: Secondary | ICD-10-CM | POA: Insufficient documentation

## 2021-05-25 MED ORDER — DOXYCYCLINE HYCLATE 100 MG PO CAPS
100.0000 mg | ORAL_CAPSULE | Freq: Two times a day (BID) | ORAL | 0 refills | Status: DC
Start: 2021-05-25 — End: 2024-03-22

## 2021-05-25 MED ORDER — LIDOCAINE-EPINEPHRINE (PF) 2 %-1:200000 IJ SOLN
10.0000 mL | Freq: Once | INTRAMUSCULAR | Status: AC
  Administered 2021-05-25: 10 mL via INTRADERMAL
  Filled 2021-05-25: qty 20

## 2021-05-25 MED ORDER — DOXYCYCLINE HYCLATE 100 MG PO CAPS
100.0000 mg | ORAL_CAPSULE | Freq: Two times a day (BID) | ORAL | 0 refills | Status: DC
Start: 2021-05-25 — End: 2021-05-25

## 2021-05-25 NOTE — ED Notes (Signed)
An After Visit Summary was printed and given to the patient. Discharge instructions given and no further questions at this time.  

## 2021-05-25 NOTE — Discharge Instructions (Signed)
Warm compressess at least 4x a day.  Return for rapid spreading redness, fever. Follow up with your doctor in the office.

## 2021-05-25 NOTE — ED Triage Notes (Signed)
Per pt, states boil in between her buttocks-noticed it 3 days ago-no history of the same

## 2021-05-25 NOTE — ED Provider Notes (Signed)
[facility] DEPT Provider Note   CSN: [id number] Arrival date & time: 05/25/21  1017     History Chief Complaint  Patient presents with   Abscess    Yesenia Simmons is a 50 y.o. female.  50 yo F with a cc of an abscess. Going on for about 3 days.  Denies prior hx of same.  Worsening pain, no fevers, chills.   The history is provided by the patient.  Abscess Location:  Torso and pelvis Pelvic abscess location:  Gluteal cleft Size:  2 Abscess quality: draining, fluctuance, induration, painful and redness   Red streaking: no   Duration:  2 days Progression:  Worsening Pain details:    Severity:  Moderate   Duration:  2 days   Timing:  Constant   Progression:  Worsening Chronicity:  New Relieved by:  Nothing Worsened by:  Nothing Ineffective treatments:  None tried Associated symptoms: no fever, no headaches, no nausea and no vomiting       Past Medical History:  Diagnosis Date   Arthritis    Asthma    CTS (carpal tunnel syndrome)    Hypertension    RBBB    Sinus tachycardia     Patient Active Problem List   Diagnosis Date Noted   Right wrist pain 12/29/2019   Bilateral carpal tunnel syndrome 02/28/2017    Past Surgical History:  Procedure Laterality Date   TUBAL LIGATION       OB History     Gravida  5   Para  3   Term  2   Preterm  1   AB  2   Living  3      SAB  2   IAB      Ectopic      Multiple      Live Births  3           Family History  Problem Relation Age of Onset   Stroke Maternal Grandmother    Hypertension Mother    Asthma Mother    Hypertension Brother    Hypertension Brother    Heart attack Father    Cancer Neg Hx     Social History   Tobacco Use   Smoking status: Never   Smokeless tobacco: Never  Vaping Use   Vaping Use: Never used  Substance Use Topics   Alcohol use: Yes    Comment: occ   Drug use: No    Home Medications Prior to Admission medications    Medication Sig Start Date End Date Taking? Authorizing Provider  albuterol (PROVENTIL) (5 MG/ML) 0.5% nebulizer solution Take 0.5 mLs (2.5 mg total) by nebulization every 6 (six) hours as needed for wheezing or shortness of breath. 02/21/20   Walisiewicz, Kaitlyn E, PA-C  albuterol (VENTOLIN HFA) 108 (90 Base) MCG/ACT inhaler Inhale 2 puffs into the lungs every 4 (four) hours as needed for wheezing or shortness of breath (or coughing). 8/33/82   Delora Fuel, MD  amLODipine (NORVASC) 10 MG tablet Take 1 tablet by mouth daily. No refills until seen 10/27/20   Kerin Perna, NP  aspirin EC 81 MG tablet Take 1 tablet (81 mg total) by mouth daily. 04/01/17   Clent Demark, PA-C  benzonatate (TESSALON) 100 MG capsule Take 1 capsule (100 mg total) by mouth every 8 (eight) hours. 12/13/19   Chase Picket, MD  budesonide-formoterol (SYMBICORT) 160-4.5 MCG/ACT inhaler Inhale 2 puffs by mouth into the lungs 2 times  daily. 10/30/20   Kerin Perna, NP  butalbital-aspirin-caffeine [facility]) [phone] MG capsule TAKE 1 CAPSULE BY MOUTH EVERY 6 HOURS AS NEEDED FOR HEADACHE 12/03/19   Kerin Perna, NP  Calcium Carbonate-Vit D-Min (CALTRATE 600+D PLUS MINERALS) 600-800 MG-UNIT TABS Take 2 tablets by mouth daily. 07/15/19   Kerin Perna, NP  Cholecalciferol (VITAMIN D3 MAXIMUM STRENGTH PO) Take 1 tablet by mouth daily at 3 pm. (1600)    [provider]  doxycycline (VIBRAMYCIN) 100 MG capsule Take 1 capsule (100 mg total) by mouth 2 (two) times daily. One po bid x 7 days 05/25/21   Deno Etienne, DO  fluticasone [facility]) 50 MCG/ACT nasal spray Place 2 sprays into both nostrils daily. 11/16/19   Kerin Perna, NP  gabapentin (NEURONTIN) 300 MG capsule Take 1 capsule (300 mg total) by mouth 3 (three) times daily. 07/19/20   Kerin Perna, NP  losartan-hydrochlorothiazide (HYZAAR) 100-25 MG tablet Take 1 tablet by mouth every morning. Needs appointment for refills 05/03/20    Kerin Perna, NP  megestrol (MEGACE) 40 MG tablet Take 1 tablet (40 mg total) by mouth 2 (two) times daily. Can increase to two tablets twice a day in the event of heavy bleeding 03/29/19   Lavonia Drafts, MD  Melatonin 5 MG TABS Take 1 tablet (5 mg total) by mouth at bedtime as needed. 07/15/19   Kerin Perna, NP  montelukast (SINGULAIR) 10 MG tablet Take 1 tablet by mouth daily in the afternoon. 10/23/20   Kerin Perna, NP  predniSONE (STERAPRED UNI-PAK 21 TAB) 10 MG (21) TBPK tablet Take as directed 12/31/19   Leandrew Koyanagi, MD  Vitamin D, Ergocalciferol, (DRISDOL) 1.25 MG (50000 UT) CAPS capsule Take 1 capsule (50,000 Units total) by mouth every 7 (seven) days. 11/29/18   Kerin Perna, NP    Allergies    Other, Eggs or egg-derived products, Fish allergy, and Iodine  Review of Systems   Review of Systems  Constitutional:  Negative for chills and fever.  HENT:  Negative for congestion and rhinorrhea.   Eyes:  Negative for redness and visual disturbance.  Respiratory:  Negative for shortness of breath and wheezing.   Cardiovascular:  Negative for chest pain and palpitations.  Gastrointestinal:  Negative for nausea and vomiting.  Genitourinary:  Negative for dysuria and urgency.  Musculoskeletal:  Negative for arthralgias and myalgias.  Skin:  Positive for color change. Negative for pallor and wound.  Neurological:  Negative for dizziness and headaches.   Physical Exam Updated Vital Signs BP 131/68   Pulse 88   Temp 98 F (36.7 C) (Oral)   Resp 18   LMP 08/04/2016 Comment: patient states bleeding is "spotting"  SpO2 99%   Physical Exam Vitals and nursing note reviewed.  Constitutional:      General: She is not in acute distress.    Appearance: She is well-developed. She is not diaphoretic.  HENT:     Head: Normocephalic and atraumatic.  Eyes:     Pupils: Pupils are equal, round, and reactive to light.  Cardiovascular:     Rate and Rhythm:  Normal rate and regular rhythm.     Heart sounds: No murmur heard.   No friction rub. No gallop.  Pulmonary:     Effort: Pulmonary effort is normal.     Breath sounds: No wheezing or rales.  Abdominal:     General: There is no distension.     Palpations: Abdomen is soft.  Tenderness: There is no abdominal tenderness.  Genitourinary:    Comments: Area of fluctuance and induration to the pilonidal region.  Small break in the skin with some purulent drainage. Musculoskeletal:        General: No tenderness.     Cervical back: Normal range of motion and neck supple.  Skin:    General: Skin is warm and dry.  Neurological:     Mental Status: She is alert and oriented to person, place, and time.  Psychiatric:        Behavior: Behavior normal.    ED Results / Procedures / Treatments   Labs (all labs ordered are listed, but only abnormal results are displayed) Labs Reviewed - No data to display  EKG None  Radiology No results found.  Procedures .Marland KitchenIncision and Drainage  Date/Time: 05/25/2021 2:00 PM Performed by: Deno Etienne, DO Authorized by: Deno Etienne, DO   Consent:    Consent obtained:  Verbal   Consent given by:  Patient   Risks, benefits, and alternatives were discussed: yes     Risks discussed:  Bleeding, damage to other organs, incomplete drainage and infection   Alternatives discussed:  No treatment, delayed treatment and alternative treatment Universal protocol:    Procedure explained and questions answered to patient or proxy's satisfaction: yes     Relevant documents present and verified: yes     Immediately prior to procedure, a time out was called: yes     Patient identity confirmed:  Verbally with patient Location:    Type:  Abscess   Location:  Anogenital   Anogenital location:  Pilonidal Pre-procedure details:    Skin preparation:  Chlorhexidine Sedation:    Sedation type:  None Anesthesia:    Anesthesia method:  Local infiltration   Local  anesthetic:  Lidocaine 2% WITH epi Procedure type:    Complexity:  Complex Procedure details:    Ultrasound guidance: no     Needle aspiration: no     Incision types:  Single straight   Incision depth:  Subcutaneous   Wound management:  Probed and deloculated   Drainage:  Purulent   Drainage amount:  Moderate   Wound treatment:  Wound left open   Packing materials:  None Post-procedure details:    Procedure completion:  Tolerated well, no immediate complications   Medications Ordered in ED Medications  lidocaine-EPINEPHrine (XYLOCAINE W/EPI) 2 %-1:200000 (PF) injection 10 mL (10 mLs Intradermal Given by Other 05/25/21 1236)    ED Course  I have reviewed the triage vital signs and the nursing notes.  Pertinent labs & imaging results that were available during my care of the patient were reviewed by me and considered in my medical decision making (see chart for details).    MDM Rules/Calculators/A&P                           50 yo F with a chief complaints of likely a pilonidal abscess.  Going on for about 3 to 4 days.  No history of the same no fevers no nausea no vomiting.  Will I&D at bedside.  2:00 PM:  I have discussed the diagnosis/risks/treatment options with the patient and believe the pt to be eligible for discharge home to follow-up with PCP. We also discussed returning to the ED immediately if new or worsening sx occur. We discussed the sx which are most concerning (e.g., sudden worsening pain, fever, inability to tolerate by mouth) that necessitate immediate  return. Medications administered to the patient during their visit and any new prescriptions provided to the patient are listed below.  Medications given during this visit Medications  lidocaine-EPINEPHrine (XYLOCAINE W/EPI) 2 %-1:200000 (PF) injection 10 mL (10 mLs Intradermal Given by Other 05/25/21 1236)     The patient appears reasonably screen and/or stabilized for discharge and I doubt any other medical  condition or other [facility] requiring further screening, evaluation, or treatment in the ED at this time prior to discharge.   Final Clinical Impression(s) / ED Diagnoses Final diagnoses:  Abscess    Rx / DC Orders ED Discharge Orders          Ordered    doxycycline (VIBRAMYCIN) 100 MG capsule  2 times daily,   Status:  Discontinued        05/25/21 1311    doxycycline (VIBRAMYCIN) 100 MG capsule  2 times daily        05/25/21 [place], Chiyeko Ferre, DO 05/25/21 1400

## 2021-05-28 ENCOUNTER — Ambulatory Visit (INDEPENDENT_AMBULATORY_CARE_PROVIDER_SITE_OTHER): Payer: Self-pay

## 2021-05-28 NOTE — Telephone Encounter (Signed)
Pt with boil to top of cleft above buttocks. Pt was seen in ED and was lanced and prescribed abx.  Pt called this am in severe pain and yellow and bloody drainage.  No fever. Called [facility] and PCP not in office. Advised pt to go to UC and care advice given. Pt verbalized understanding           Reason for Disposition  SEVERE pain (e.g., excruciating)  Answer Assessment - Initial Assessment Questions 1. APPEARANCE of BOIL: "What does the boil look like?"      Cannot see 2. LOCATION: "Where is the boil located?"      Top of buttock at the cleft 3. NUMBER: "How many boils are there?"      1 4. SIZE: "How big is the boil?" (e.g., inches, cm; compare to size of a coin or other object)     unknown 5. ONSET: "When did the boil start?"     Last Monday 6. PAIN: "Is there any pain?" If Yes, ask: "How bad is the pain?"   (Scale 1-10; or mild, moderate, severe)     severe 7. FEVER: "Do you have a fever?" If Yes, ask: "What is it, how was it measured, and when did it start?"      no 8. SOURCE: "Have you been around anyone with boils or other Staph infections?" "Have you ever had boils before?"     No/no 9. OTHER SYMPTOMS: "Do you have any other symptoms?" (e.g., shaking chills, weakness, rash elsewhere on body)     Rash under breast and neck itching 10. PREGNANCY: "Is there any chance you are pregnant?" "When was your last menstrual period?"       No/menopause  Protocols used: Boil (Skin Abscess)-A-AH

## 2021-06-12 ENCOUNTER — Ambulatory Visit (INDEPENDENT_AMBULATORY_CARE_PROVIDER_SITE_OTHER): Payer: Self-pay | Admitting: Primary Care

## 2021-08-20 IMAGING — DX DG WRIST COMPLETE 3+V*R*
4 series · 4 of 4 positions shown · non-contrast
Comparison: None.

CLINICAL DATA: Right wrist pain for the past few weeks. No injury
or prior surgery.

EXAM:
RIGHT WRIST - COMPLETE 3+ VIEW

[wrist pa]
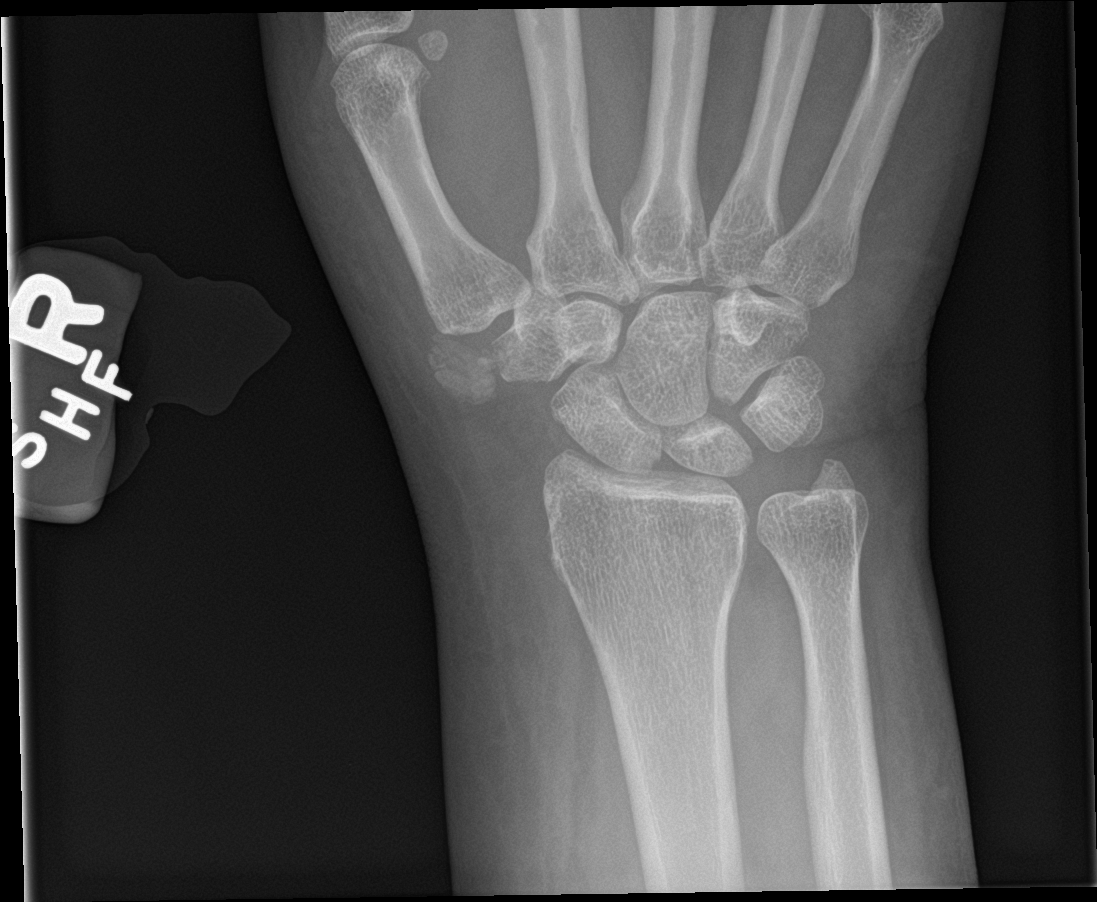

[wrist obl]
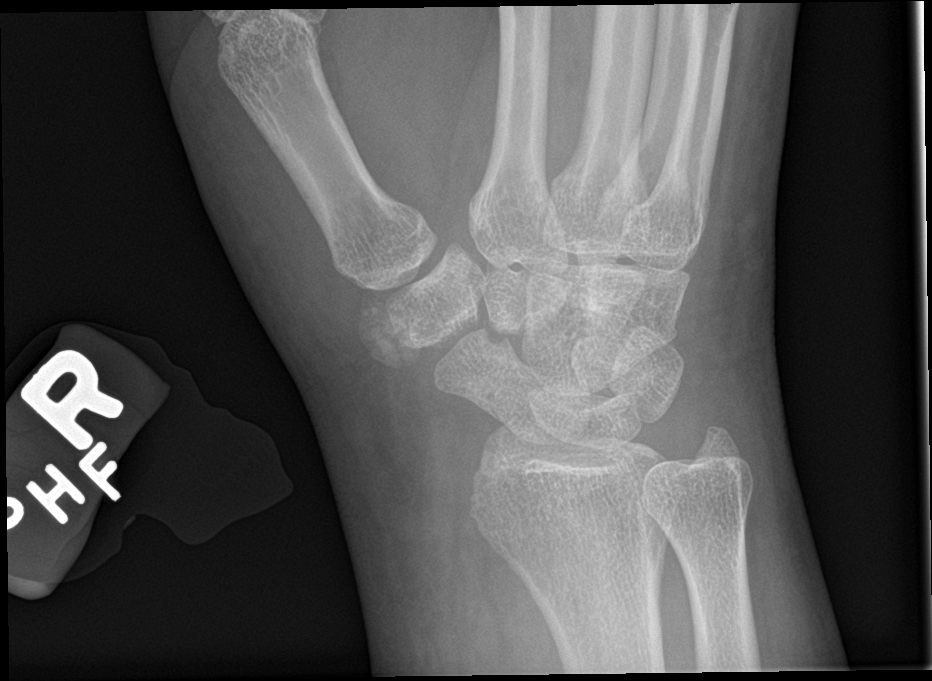

[wrist lat]
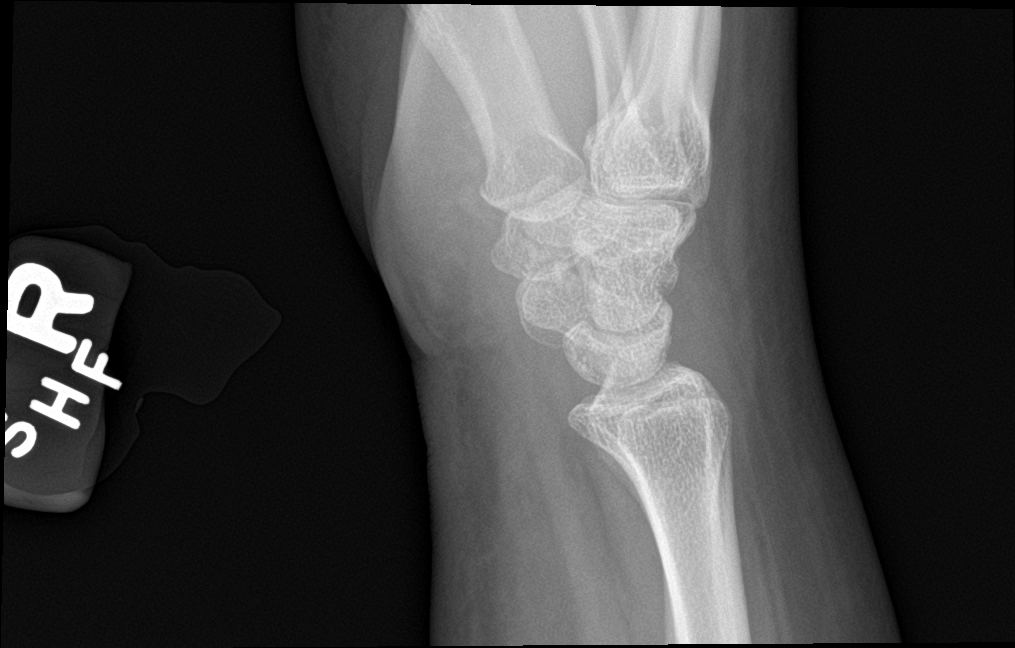

[wrist navicular]
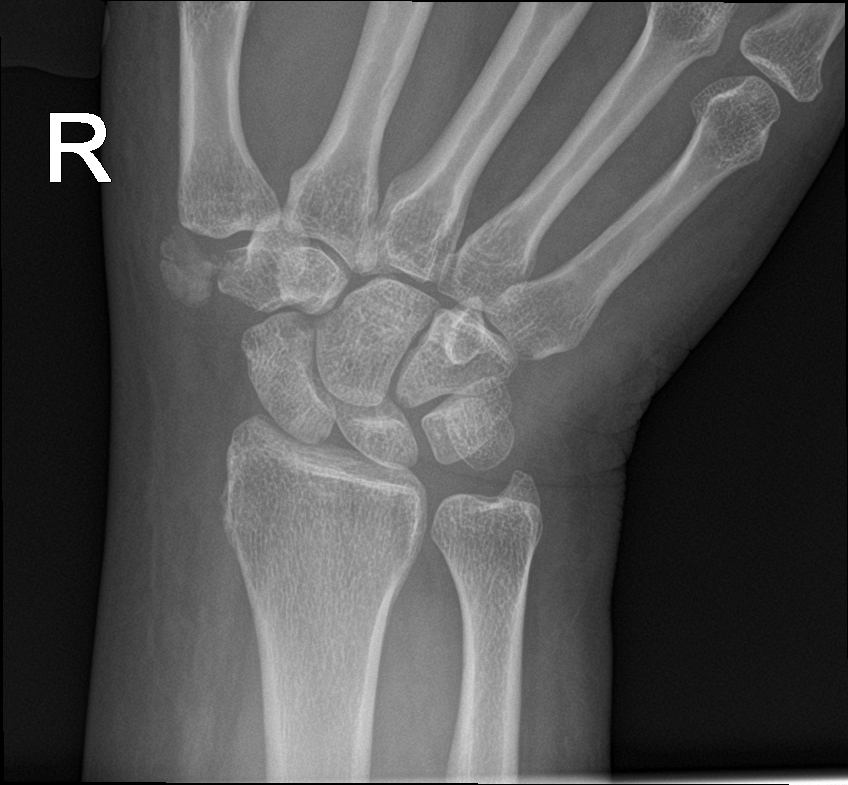

[4 of 4 positions shown; findings below may reference images not displayed]

FINDINGS: No acute fracture or dislocation. Small erosions involving the
scaphoid, hamate, and base of the second metacarpal. Joint spaces
are preserved. Bone mineralization is normal. 1.2 cm globular
amorphous calcification adjacent to the trapezium. Soft tissue
swelling around the wrist.
IMPRESSION: 1. Small erosions involving the scaphoid, hamate, and base of the
second metacarpal, suspicious for inflammatory arthropathy.
2. 1.2 cm globular calcification adjacent to the trapezium, likely
hydroxyapatite deposition disease.

## 2022-10-14 ENCOUNTER — Encounter: Payer: Self-pay | Admitting: *Deleted

## 2023-08-08 NOTE — Telephone Encounter (Addendum)
 This pt has not been seen since April 2024, will she need to be seen for a referral and she needs to sign a release for records to be sent somewhere I think.

## 2024-01-28 ENCOUNTER — Other Ambulatory Visit: Payer: Self-pay | Admitting: Anesthesiology

## 2024-01-28 DIAGNOSIS — M545 Low back pain, unspecified: Secondary | ICD-10-CM

## 2024-01-28 DIAGNOSIS — M5136 Other intervertebral disc degeneration, lumbar region with discogenic back pain only: Secondary | ICD-10-CM

## 2024-02-05 ENCOUNTER — Encounter: Payer: Self-pay | Admitting: Anesthesiology

## 2024-02-16 ENCOUNTER — Other Ambulatory Visit

## 2024-02-24 ENCOUNTER — Ambulatory Visit: Admitting: Orthopedic Surgery

## 2024-02-24 DIAGNOSIS — G5603 Carpal tunnel syndrome, bilateral upper limbs: Secondary | ICD-10-CM

## 2024-02-24 NOTE — Progress Notes (Signed)
 JENAN ELLEGOOD - 53 y.o. female MRN [id number]  Date of birth: May 17, 1971  Office Visit Note: Visit Date: 02/24/2024 PCP: Celestia Rosaline SQUIBB, NP Referred by: Celestia Rosaline SQUIBB, NP  Subjective: No chief complaint on file.  HPI: IOWA KAPPES is a pleasant 53 y.o. female who presents today for evaluation of bilateral hand numbness and tingling, right greater than left.  Symptoms have been present for multiple years, she estimates somewhere between 78 years and worsening in nature.  She has nocturnal symptoms as well.  Numbness and tingling is interfering with activities of daily living.  She has trialed extensive nonoperative treatment with bracing, previous injections and activity modification without lasting relief.  At this juncture, she is interested in potential surgical intervention.  She has undergone prior EMGs which does confirm bilateral carpal tunnel syndrome.  Pertinent ROS were reviewed with the patient and found to be negative unless otherwise specified above in HPI.   Visit Reason: Right Carpal tunnel, symptoms in both-right is worse in thumb and index Duration of symptoms:7-8 years -getting worse Hand dominance: right Occupation: PCA Diabetic: No Smoking: No Heart/Lung History:none Blood Thinners: baby aspirin  daily  Prior Testing/EMG:EMG on 02/28/17 Injections (Date): Tried injs by Dr Jerri in 2020 Treatments:Bracing didn't help Prior Surgery: none  Assessment & Plan: Visit Diagnoses:  1. Bilateral carpal tunnel syndrome     Plan: Extensive discussion was had with the patient today about her ongoing bilateral carpal tunnel syndrome that is refractory to conservative care.  Patient has both clinical and electrodiagnostic evidence to confirm this diagnosis.  At this juncture, she is indicated for bilateral, staged open versus endoscopic carpal tunnel release.  Risks and benefits of both operations were discussed in detail today.  Understanding all risks and  benefits, patient would like to have surgery done in the form of bilateral, staged endoscopic carpal tunnel release.  She would like to begin with the right side.  Risks include but not limited to infection, bleeding, scarring, stiffness, nerve injury or vascular, tendon injury, risk of recurrence and need for subsequent operation were all discussed in detail.  Patient consented understanding the above.  Will move forward surgical scheduling of right endoscopic carpal tunnel release at the next available date per patient preference.   Follow-up: No follow-ups on file.   Meds & Orders: No orders of the defined types were placed in this encounter.  No orders of the defined types were placed in this encounter.    Procedures: No procedures performed      Clinical History: No specialty comments available.  She reports that she has never smoked. She has never used smokeless tobacco. No results for input(s): HGBA1C, LABURIC in the last 8760 hours.  Objective:   Vital Signs: LMP 08/04/2016 Comment: patient states bleeding is spotting  Physical Exam  Gen: Well-appearing, in no acute distress; non-toxic CV: Regular Rate. Well-perfused. Warm.  Resp: Breathing unlabored on room air; no wheezing. Psych: Fluid speech in conversation; appropriate affect; normal thought process  Ortho Exam PHYSICAL EXAM:  General: Patient is well appearing and in no distress.   Skin and Muscle: No significant skin changes are apparent to upper extremities.   Range of Motion and Palpation Tests: Mobility is full about the elbows with flexion and extension. Forearm supination and pronation are 85/85 bilaterally.  Wrist flexion/extension is 75/65 bilaterally.  Digital flexion and extension are full.  Thumb opposition is full to the base of the small fingers bilaterally.    No  cords or nodules are palpated.  No triggering is observed.     Neurologic, Vascular, Motor: Sensation is diminished to light  touch in the bilateral median nerve distribution.    Thenar atrophy: Negative bilaterally Tinel sign: Positive bilateral carpal tunnel Carpal tunnel compression: Positive bilateral Phalen test: Positive bilateral  Motor bilateral hand FPL: 5/5 Index FDP: 5/5 APB: 5/5  Fingers pink and well perfused.  Capillary refill is brisk.     Lab Results  Component Value Date   HGBA1C 5.1 04/08/2018     Imaging: No results found.  Past Medical/Family/Surgical/Social History: Medications & Allergies reviewed per EMR, new medications updated. Patient Active Problem List   Diagnosis Date Noted   Right wrist pain 12/29/2019   Bilateral carpal tunnel syndrome 02/28/2017   Past Medical History:  Diagnosis Date   Arthritis    Asthma    CTS (carpal tunnel syndrome)    Hypertension    RBBB    Sinus tachycardia    Family History  Problem Relation Age of Onset   Stroke Maternal Grandmother    Hypertension Mother    Asthma Mother    Hypertension Brother    Hypertension Brother    Heart attack Father    Cancer Neg Hx    Past Surgical History:  Procedure Laterality Date   TUBAL LIGATION     Social History   Occupational History   Occupation: in between jobs  Tobacco Use   Smoking status: Never   Smokeless tobacco: Never  Vaping Use   Vaping status: Never Used  Substance and Sexual Activity   Alcohol use: Yes    Comment: occ   Drug use: No   Sexual activity: Yes    Birth control/protection: Surgical    Asuncion Shibata Estela) Arlinda, M.D. [facility], Hand Surgery

## 2024-03-18 ENCOUNTER — Other Ambulatory Visit: Payer: Self-pay

## 2024-03-18 DIAGNOSIS — G5603 Carpal tunnel syndrome, bilateral upper limbs: Secondary | ICD-10-CM

## 2024-03-22 ENCOUNTER — Encounter (HOSPITAL_BASED_OUTPATIENT_CLINIC_OR_DEPARTMENT_OTHER): Payer: Self-pay | Admitting: Orthopedic Surgery

## 2024-03-24 ENCOUNTER — Encounter (HOSPITAL_BASED_OUTPATIENT_CLINIC_OR_DEPARTMENT_OTHER)
Admission: RE | Admit: 2024-03-24 | Discharge: 2024-03-24 | Disposition: A | Discharge status: Home / Self Care | Attending: Orthopedic Surgery | Admitting: Orthopedic Surgery

## 2024-03-24 ENCOUNTER — Encounter (HOSPITAL_BASED_OUTPATIENT_CLINIC_OR_DEPARTMENT_OTHER)
Admission: RE | Admit: 2024-03-24 | Discharge: 2024-03-24 | Disposition: A | Discharge status: Home / Self Care | Source: Ambulatory Visit | Attending: Orthopedic Surgery | Admitting: Orthopedic Surgery

## 2024-03-24 DIAGNOSIS — Z79899 Other long term (current) drug therapy: Secondary | ICD-10-CM | POA: Insufficient documentation

## 2024-03-24 DIAGNOSIS — Z01818 Encounter for other preprocedural examination: Secondary | ICD-10-CM | POA: Insufficient documentation

## 2024-03-24 LAB — BASIC METABOLIC PANEL WITH GFR
Anion gap: 12 (ref 5–15)
BUN: 13 mg/dL (ref 6–20)
CO2: 29 mmol/L (ref 22–32)
Calcium: 9.2 mg/dL (ref 8.9–10.3)
Chloride: 100 mmol/L (ref 98–111)
Creatinine, Ser: 0.63 mg/dL (ref 0.44–1.00)
GFR, Estimated: >60 mL/min (ref >60)
Glucose, Bld: 81 mg/dL (ref 70–99)
Potassium: 3.4 mmol/L — ABNORMAL LOW (ref 3.5–5.1)
Sodium: 141 mmol/L (ref 135–145)

## 2024-03-24 NOTE — Progress Notes (Signed)

## 2024-03-26 ENCOUNTER — Encounter (HOSPITAL_BASED_OUTPATIENT_CLINIC_OR_DEPARTMENT_OTHER): Admission: RE | Payer: Self-pay | Source: Home / Self Care

## 2024-03-26 ENCOUNTER — Telehealth: Payer: Self-pay | Admitting: Orthopedic Surgery

## 2024-03-26 ENCOUNTER — Ambulatory Visit (HOSPITAL_BASED_OUTPATIENT_CLINIC_OR_DEPARTMENT_OTHER): Admission: RE | Admit: 2024-03-26 | Source: Home / Self Care | Admitting: Orthopedic Surgery

## 2024-03-26 DIAGNOSIS — Z79899 Other long term (current) drug therapy: Secondary | ICD-10-CM

## 2024-03-26 DIAGNOSIS — Z01818 Encounter for other preprocedural examination: Secondary | ICD-10-CM

## 2024-03-26 SURGERY — RELEASE, CARPAL TUNNEL, ENDOSCOPIC
Anesthesia: General | Laterality: Right

## 2024-03-26 NOTE — Telephone Encounter (Signed)
 Pt came in office to reschedule surgery. Please call pt [phone].

## 2024-03-29 ENCOUNTER — Telehealth: Payer: Self-pay | Admitting: Orthopedic Surgery

## 2024-03-29 NOTE — Telephone Encounter (Signed)
 I spoke with patient today in regards to rescheduling surgery. Insurance issues seem to be corrected at this point, and patient has been rescheduled for surgery on 10/17. Patient is requesting a work note to be out of work for how ever long doctor thinks she will be out. Patient lifts and moves patients all day. Please call patient when note is ready.

## 2024-03-30 NOTE — Telephone Encounter (Signed)
 2 week oow from day of surgery

## 2024-04-06 ENCOUNTER — Encounter: Payer: Self-pay | Admitting: Orthopedic Surgery

## 2024-04-06 NOTE — Telephone Encounter (Signed)
 Note in chart

## 2024-04-08 ENCOUNTER — Encounter: Admitting: Orthopedic Surgery

## 2024-04-12 ENCOUNTER — Other Ambulatory Visit: Payer: Self-pay

## 2024-04-12 ENCOUNTER — Encounter (HOSPITAL_BASED_OUTPATIENT_CLINIC_OR_DEPARTMENT_OTHER): Payer: Self-pay | Admitting: Orthopedic Surgery

## 2024-04-16 ENCOUNTER — Ambulatory Visit (HOSPITAL_BASED_OUTPATIENT_CLINIC_OR_DEPARTMENT_OTHER)
Admission: RE | Admit: 2024-04-16 | Discharge: 2024-04-16 | Disposition: A | Discharge status: Home / Self Care | Attending: Orthopedic Surgery | Admitting: Orthopedic Surgery

## 2024-04-16 ENCOUNTER — Ambulatory Visit (HOSPITAL_BASED_OUTPATIENT_CLINIC_OR_DEPARTMENT_OTHER): Admitting: Anesthesiology

## 2024-04-16 ENCOUNTER — Encounter (HOSPITAL_BASED_OUTPATIENT_CLINIC_OR_DEPARTMENT_OTHER)
Admission: RE | Disposition: A | Discharge status: Home / Self Care | Payer: Self-pay | Source: Home / Self Care | Attending: Orthopedic Surgery

## 2024-04-16 ENCOUNTER — Encounter (HOSPITAL_BASED_OUTPATIENT_CLINIC_OR_DEPARTMENT_OTHER): Payer: Self-pay | Admitting: Orthopedic Surgery

## 2024-04-16 ENCOUNTER — Other Ambulatory Visit: Payer: Self-pay

## 2024-04-16 DIAGNOSIS — G5601 Carpal tunnel syndrome, right upper limb: Secondary | ICD-10-CM | POA: Diagnosis present

## 2024-04-16 DIAGNOSIS — J45909 Unspecified asthma, uncomplicated: Secondary | ICD-10-CM | POA: Insufficient documentation

## 2024-04-16 DIAGNOSIS — M25531 Pain in right wrist: Secondary | ICD-10-CM

## 2024-04-16 DIAGNOSIS — I10 Essential (primary) hypertension: Secondary | ICD-10-CM | POA: Diagnosis not present

## 2024-04-16 DIAGNOSIS — E119 Type 2 diabetes mellitus without complications: Secondary | ICD-10-CM | POA: Insufficient documentation

## 2024-04-16 DIAGNOSIS — Z7984 Long term (current) use of oral hypoglycemic drugs: Secondary | ICD-10-CM | POA: Diagnosis not present

## 2024-04-16 HISTORY — DX: Type 2 diabetes mellitus without complications: E11.9

## 2024-04-16 HISTORY — PX: CARPAL TUNNEL RELEASE: SHX101

## 2024-04-16 LAB — GLUCOSE, CAPILLARY
Glucose-Capillary: 85 mg/dL (ref 70–99)
Glucose-Capillary: 84 mg/dL (ref 70–99)

## 2024-04-16 SURGERY — RELEASE, CARPAL TUNNEL, ENDOSCOPIC
Anesthesia: General | Laterality: Right

## 2024-04-16 MED ORDER — CEFAZOLIN SODIUM-DEXTROSE 2-4 GM/100ML-% IV SOLN
INTRAVENOUS | Status: AC
  Filled 2024-04-16: qty 100

## 2024-04-16 MED ORDER — PHENYLEPHRINE 80 MCG/ML (10ML) SYRINGE FOR IV PUSH (FOR BLOOD PRESSURE SUPPORT)
PREFILLED_SYRINGE | INTRAVENOUS | Status: AC
  Filled 2024-04-16: qty 10

## 2024-04-16 MED ORDER — 0.9 % SODIUM CHLORIDE (POUR BTL) OPTIME
TOPICAL | Status: DC | PRN
  Administered 2024-04-16: 200 mL

## 2024-04-16 MED ORDER — DEXAMETHASONE SOD PHOSPHATE PF 10 MG/ML IJ SOLN
INTRAMUSCULAR | Status: DC | PRN
  Administered 2024-04-16: 5 mg via INTRAVENOUS

## 2024-04-16 MED ORDER — FENTANYL CITRATE (PF) 100 MCG/2ML IJ SOLN
INTRAMUSCULAR | Status: DC | PRN
  Administered 2024-04-16 (×2): 50 ug via INTRAVENOUS

## 2024-04-16 MED ORDER — CEFAZOLIN SODIUM-DEXTROSE 2-4 GM/100ML-% IV SOLN
2.0000 g | INTRAVENOUS | Status: AC
  Administered 2024-04-16: 2 g via INTRAVENOUS

## 2024-04-16 MED ORDER — LIDOCAINE-EPINEPHRINE 1 %-1:100000 IJ SOLN
INTRAMUSCULAR | Status: AC
  Filled 2024-04-16: qty 1

## 2024-04-16 MED ORDER — SUCCINYLCHOLINE CHLORIDE 200 MG/10ML IV SOSY
PREFILLED_SYRINGE | INTRAVENOUS | Status: AC
  Filled 2024-04-16: qty 10

## 2024-04-16 MED ORDER — FENTANYL CITRATE (PF) 100 MCG/2ML IJ SOLN
25.0000 ug | INTRAMUSCULAR | Status: DC | PRN
  Administered 2024-04-16: 50 ug via INTRAVENOUS

## 2024-04-16 MED ORDER — FENTANYL CITRATE (PF) 100 MCG/2ML IJ SOLN
INTRAMUSCULAR | Status: AC
  Filled 2024-04-16: qty 2

## 2024-04-16 MED ORDER — DROPERIDOL 2.5 MG/ML IJ SOLN: 0.6250 mg | Freq: Once | INTRAMUSCULAR | Status: DC | PRN

## 2024-04-16 MED ORDER — TRAMADOL HCL 50 MG PO TABS: 50.0000 mg | ORAL_TABLET | Freq: Four times a day (QID) | ORAL | 0 refills | Status: DC | PRN

## 2024-04-16 MED ORDER — MIDAZOLAM HCL 2 MG/2ML IJ SOLN
INTRAMUSCULAR | Status: AC
  Filled 2024-04-16: qty 2

## 2024-04-16 MED ORDER — OXYCODONE HCL 5 MG PO TABS
ORAL_TABLET | ORAL | Status: AC
  Filled 2024-04-16: qty 1

## 2024-04-16 MED ORDER — ACETAMINOPHEN 10 MG/ML IV SOLN
INTRAVENOUS | Status: AC
  Filled 2024-04-16: qty 100

## 2024-04-16 MED ORDER — OXYCODONE HCL 5 MG PO TABS
5.0000 mg | ORAL_TABLET | Freq: Once | ORAL | Status: AC
  Administered 2024-04-16: 5 mg via ORAL

## 2024-04-16 MED ORDER — LIDOCAINE 2% (20 MG/ML) 5 ML SYRINGE
INTRAMUSCULAR | Status: DC | PRN
  Administered 2024-04-16: 60 mg via INTRAVENOUS

## 2024-04-16 MED ORDER — ONDANSETRON HCL 4 MG/2ML IJ SOLN
INTRAMUSCULAR | Status: AC
  Filled 2024-04-16: qty 2

## 2024-04-16 MED ORDER — EPHEDRINE 5 MG/ML INJ
INTRAVENOUS | Status: AC
  Filled 2024-04-16: qty 5

## 2024-04-16 MED ORDER — ONDANSETRON HCL 4 MG/2ML IJ SOLN
INTRAMUSCULAR | Status: DC | PRN
  Administered 2024-04-16: 4 mg via INTRAVENOUS

## 2024-04-16 MED ORDER — LACTATED RINGERS IV SOLN: INTRAVENOUS | Status: DC

## 2024-04-16 MED ORDER — ACETAMINOPHEN 10 MG/ML IV SOLN
1000.0000 mg | Freq: Once | INTRAVENOUS | Status: DC | PRN
  Administered 2024-04-16: 1000 mg via INTRAVENOUS

## 2024-04-16 MED ORDER — MIDAZOLAM HCL 5 MG/5ML IJ SOLN
INTRAMUSCULAR | Status: DC | PRN
  Administered 2024-04-16: 2 mg via INTRAVENOUS

## 2024-04-16 MED ORDER — LIDOCAINE 2% (20 MG/ML) 5 ML SYRINGE
INTRAMUSCULAR | Status: AC
  Filled 2024-04-16: qty 5

## 2024-04-16 MED ADMIN — PROPOFOL 200 MG/20ML IV EMUL: 180 mg | INTRAVENOUS | NDC 00069020910

## 2024-04-16 MED FILL — Atropine Sulfate IV Soln 0.4 MG/ML: INTRAVENOUS | Qty: 1 | Status: AC

## 2024-04-16 SURGICAL SUPPLY — 31 items
APPLICATOR COTTON TIP 6 STRL (MISCELLANEOUS) ×2 IMPLANT
BLADE SURG 15 STRL LF DISP TIS (BLADE) ×4 IMPLANT
BNDG COHESIVE 4X5 TAN STRL LF (GAUZE/BANDAGES/DRESSINGS) ×2 IMPLANT
BNDG COMPR ESMARK 4X3 LF (GAUZE/BANDAGES/DRESSINGS) ×2 IMPLANT
CHLORAPREP W/TINT 26 (MISCELLANEOUS) ×2 IMPLANT
CORD BIPOLAR FORCEPS 12FT (ELECTRODE) ×2 IMPLANT
COVER BACK TABLE 60X90IN (DRAPES) ×2 IMPLANT
CUFF TOURN SGL QUICK 18X4 (TOURNIQUET CUFF) IMPLANT
DRAPE HAND 77X146 (DRAPES) ×2 IMPLANT
DRAPE SURG 17X23 STRL (DRAPES) ×2 IMPLANT
DRSG TEGADERM 2-3/8X2-3/4 SM (GAUZE/BANDAGES/DRESSINGS) ×2 IMPLANT
GAUZE SPONGE 4X4 12PLY STRL LF (GAUZE/BANDAGES/DRESSINGS) ×2 IMPLANT
GAUZE XEROFORM 1X8 LF (GAUZE/BANDAGES/DRESSINGS) ×2 IMPLANT
GLOVE BIO SURGEON STRL SZ7.5 (GLOVE) ×2 IMPLANT
GLOVE BIOGEL PI IND STRL 7.5 (GLOVE) ×2 IMPLANT
GOWN STRL REUS W/ TWL LRG LVL3 (GOWN DISPOSABLE) ×4 IMPLANT
GOWN STRL SURGICAL XL XLNG (GOWN DISPOSABLE) ×4 IMPLANT
KIT ENDO FORWARD CUT SAFEVIEW (SYSTAGENIX WOUND MANAGEMENT) ×2 IMPLANT
NDL HYPO 25X5/8 SAFETYGLIDE (NEEDLE) IMPLANT
NEEDLE HYPO 25X5/8 SAFETYGLIDE (NEEDLE) IMPLANT
NS IRRIG 1000ML POUR BTL (IV SOLUTION) IMPLANT
PACK BASIN DAY SURGERY FS (CUSTOM PROCEDURE TRAY) ×2 IMPLANT
SHEET MEDIUM DRAPE 40X70 STRL (DRAPES) ×2 IMPLANT
SOLUTION ANTFG W/FOAM PAD STRL (MISCELLANEOUS) ×2 IMPLANT
SPIKE FLUID TRANSFER (MISCELLANEOUS) IMPLANT
STOCKINETTE IMPERVIOUS 9X36 MD (GAUZE/BANDAGES/DRESSINGS) ×2 IMPLANT
SUT ETHILON 4 0 PS 2 18 (SUTURE) ×2 IMPLANT
SYR BULB EAR ULCER 3OZ GRN STR (SYRINGE) ×4 IMPLANT
SYR CONTROL 10ML LL (SYRINGE) ×2 IMPLANT
TOWEL GREEN STERILE FF (TOWEL DISPOSABLE) ×4 IMPLANT
UNDERPAD 30X36 HEAVY ABSORB (UNDERPADS AND DIAPERS) ×2 IMPLANT

## 2024-04-16 NOTE — Anesthesia Postprocedure Evaluation (Signed)
 Anesthesia Post Note  Patient: Yesenia Simmons  Procedure(s) Performed: RELEASE, CARPAL TUNNEL, ENDOSCOPIC (Right)     Patient location during evaluation: PACU Anesthesia Type: General Level of consciousness: awake and alert Pain management: pain level controlled Vital Signs Assessment: post-procedure vital signs reviewed and stable Respiratory status: spontaneous breathing, nonlabored ventilation, respiratory function stable and patient connected to nasal cannula oxygen Cardiovascular status: blood pressure returned to baseline and stable Postop Assessment: no apparent nausea or vomiting Anesthetic complications: no   No notable events documented.  Last Vitals:  Vitals:   04/16/24 1100 04/16/24 1200  BP: (!) 144/84 139/66  Pulse: 70 (!) 57  Resp: 16 16  Temp:  (!) 36.2 C  SpO2: 94% 93%    Last Pain:  Vitals:   04/16/24 1200  TempSrc:   PainSc: 5                  Lyle Niblett P [place]

## 2024-04-16 NOTE — Anesthesia Preprocedure Evaluation (Signed)
 Anesthesia Evaluation  Patient identified by MRN, date of birth, ID band Patient awake    Reviewed: Allergy & Precautions, NPO status , Patient's Chart, lab work & pertinent test results  Airway Mallampati: II  TM Distance: >3 FB Neck ROM: Full    Dental no notable dental hx.    Pulmonary asthma    Pulmonary exam normal        Cardiovascular hypertension,  Rhythm:Regular Rate:Normal     Neuro/Psych negative neurological ROS  negative psych ROS   GI/Hepatic negative GI ROS, Neg liver ROS,,,  Endo/Other  diabetes, Type 2    Renal/GU negative Renal ROS  negative genitourinary   Musculoskeletal  (+) Arthritis , Osteoarthritis,    Abdominal Normal abdominal exam  (+)   Peds  Hematology negative hematology ROS (+)   Anesthesia Other Findings   Reproductive/Obstetrics                              Anesthesia Physical Anesthesia Plan  ASA: 2  Anesthesia Plan: General   Post-op Pain Management:    Induction: Intravenous  PONV Risk Score and Plan: 3 and Ondansetron, Dexamethasone , Midazolam and Treatment may vary due to age or medical condition  Airway Management Planned: Mask and LMA  Additional Equipment: None  Intra-op Plan:   Post-operative Plan: Extubation in OR  Informed Consent: I have reviewed the patients History and Physical, chart, labs and discussed the procedure including the risks, benefits and alternatives for the proposed anesthesia with the patient or authorized representative who has indicated his/her understanding and acceptance.     Dental advisory given  Plan Discussed with: CRNA  Anesthesia Plan Comments:         Anesthesia Quick Evaluation

## 2024-04-16 NOTE — H&P (Signed)
 @LOGODEPT @  Yesenia Simmons - 53 y.o. female MRN [id number]  Date of birth: 12/07/1970   HAND SURGERY H&P UPDATE   HPI: Patient is a 53 y.o. female who presents with right carpal tunnel syndrome, here today for endoscopic carpal tunnel release.  Patient denies any changes to their medical history or new systemic symptoms today.    Past Medical History:  Diagnosis Date   Arthritis    Asthma    CTS (carpal tunnel syndrome)    DM (diabetes mellitus) (HCC)    Hypertension    RBBB    Sinus tachycardia    Past Surgical History:  Procedure Laterality Date   TUBAL LIGATION     Social History   Socioeconomic History   Marital status: Single    Spouse name: Not on file   Number of children: 3   Years of education: 12   Highest education level: Not on file  Occupational History   Occupation: in between jobs  Tobacco Use   Smoking status: Never   Smokeless tobacco: Never  Vaping Use   Vaping status: Never Used  Substance and Sexual Activity   Alcohol use: Yes    Comment: occ   Drug use: No   Sexual activity: Yes    Birth control/protection: Surgical  Other Topics Concern   Not on file  Social History Narrative   Lives with son in a 2 story home.  Has 3 children.  Currently in between jobs.  Education: high school.     Social Drivers of Corporate investment banker Strain: Low Risk  (09/25/2021)   Received from [facility]   Overall Financial Resource Strain (CARDIA)    Difficulty of Paying Living Expenses: Not hard at all  Food Insecurity: Not on file  Transportation Needs: Not on file  Physical Activity: Not on file  Stress: Not on file  Social Connections: Not on file   Family History  Problem Relation Age of Onset   Stroke Maternal Grandmother    Hypertension Mother    Asthma Mother    Hypertension Brother    Hypertension Brother    Heart attack Father    Cancer Neg Hx    - negative except otherwise stated in the family history section Allergies   Allergen Reactions   Other Hives   Egg Protein-Containing Drug Products Hives   Fish Allergy Hives   Iodine Hives   Prior to Admission medications   Medication Sig Start Date End Date Taking? Authorizing Provider  albuterol  (PROVENTIL ) (5 MG/ML) 0.5% nebulizer solution Take 0.5 mLs (2.5 mg total) by nebulization every 6 (six) hours as needed for wheezing or shortness of breath. 02/21/20  Yes Walisiewicz, Kaitlyn E, PA-C  albuterol  (VENTOLIN  HFA) 108 (90 Base) MCG/ACT inhaler Inhale 2 puffs into the lungs every 4 (four) hours as needed for wheezing or shortness of breath (or coughing). 11/26/19  Yes Raford Lenis, MD  amLODipine  (NORVASC ) 10 MG tablet Take 1 tablet by mouth daily. No refills until seen 10/27/20  Yes Celestia Rosaline SQUIBB, NP  aspirin  EC 81 MG tablet Take 1 tablet (81 mg total) by mouth daily. 04/01/17  Yes Kristy Sharolyn Lenis, PA-C  budesonide -formoterol  (SYMBICORT ) 160-4.5 MCG/ACT inhaler Inhale 2 puffs by mouth into the lungs 2 times daily. 10/30/20  Yes Celestia Rosaline SQUIBB, NP  Calcium Carbonate-Vit D-Min (CALTRATE 600+D PLUS MINERALS) 600-800 MG-UNIT TABS Take 2 tablets by mouth daily. 07/15/19  Yes Celestia Rosaline SQUIBB, NP  Cholecalciferol (VITAMIN D3 MAXIMUM STRENGTH  PO) Take 1 tablet by mouth daily at 3 pm. (1600)   Yes [provider]  fluticasone  (FLONASE ) 50 MCG/ACT nasal spray Place 2 sprays into both nostrils daily. 11/16/19  Yes Celestia Rosaline SQUIBB, NP  gabapentin  (NEURONTIN ) 300 MG capsule Take 1 capsule (300 mg total) by mouth 3 (three) times daily. 07/19/20  Yes Celestia Rosaline SQUIBB, NP  losartan -hydrochlorothiazide (HYZAAR) 100-25 MG tablet Take 1 tablet by mouth every morning. Needs appointment for refills 05/03/20  Yes Celestia Rosaline SQUIBB, NP  Melatonin 5 MG TABS Take 1 tablet (5 mg total) by mouth at bedtime as needed. 07/15/19  Yes Celestia Rosaline SQUIBB, NP  metformin (FORTAMET) 500 MG (OSM) 24 hr tablet Take 500 mg by mouth daily with breakfast.   Yes [provider]  montelukast  (SINGULAIR ) 10 MG tablet Take 1 tablet by mouth daily in the afternoon. 10/23/20  Yes Edwards, Michelle P, NP  Vitamin D , Ergocalciferol , (DRISDOL ) 1.25 MG (50000 UT) CAPS capsule Take 1 capsule (50,000 Units total) by mouth every 7 (seven) days. 11/29/18  Yes Celestia Rosaline SQUIBB, NP  butalbital -aspirin -caffeine  (FIORINAL ) 50-325-40 MG capsule TAKE 1 CAPSULE BY MOUTH EVERY 6 HOURS AS NEEDED FOR HEADACHE 12/03/19   Celestia Rosaline SQUIBB, NP   No results found. - Positive ROS: All other systems have been reviewed and were otherwise negative with the exception of those mentioned in the HPI and as above.  Physical Exam:  General: Patient is well appearing and in no distress.    Skin and Muscle: No significant skin changes are apparent to upper extremities.    Range of Motion and Palpation Tests: Mobility is full about the elbows with flexion and extension. Forearm supination and pronation are 85/85 bilaterally.  Wrist flexion/extension is 75/65 bilaterally.  Digital flexion and extension are full.  Thumb opposition is full to the base of the small fingers bilaterally.     No cords or nodules are palpated.  No triggering is observed.       Neurologic, Vascular, Motor: Sensation is diminished to light touch in the bilateral median nerve distribution.    Thenar atrophy: Negative bilaterally Tinel sign: Positive bilateral carpal tunnel Carpal tunnel compression: Positive bilateral Phalen test: Positive bilateral   Motor bilateral hand FPL: 5/5 Index FDP: 5/5 APB: 5/5   Fingers pink and well perfused.  Capillary refill is brisk.       Assessment/Plan: OR today for right endoscopic carpal tunnel release. We again reviewed the risks of surgery which include bleeding, infection, damage to neurovascular structures, persistent symptoms, need for additional surgery, also discussed possibility for conversion open surgery.  Informed consent was signed.  All questions  were answered.   Lacrystal Barbe [facility], Hand Surgery

## 2024-04-16 NOTE — Anesthesia Procedure Notes (Signed)
 Procedure Name: LMA Insertion Date/Time: 04/16/2024 9:34 AM  Performed by: Franchot Izetta ORN, CRNAPre-anesthesia Checklist: Patient identified, Emergency Drugs available, Suction available and Patient being monitored Patient Re-evaluated:Patient Re-evaluated prior to induction Oxygen Delivery Method: Circle system utilized Preoxygenation: Pre-oxygenation with 100% oxygen Induction Type: IV induction Ventilation: Mask ventilation without difficulty LMA: LMA inserted LMA Size: 4.0 Number of attempts: 1 Airway Equipment and Method: Bite block Placement Confirmation: positive ETCO2 and breath sounds checked- equal and bilateral Tube secured with: Tape Dental Injury: Teeth and Oropharynx as per pre-operative assessment

## 2024-04-16 NOTE — Op Note (Signed)
 NAME: Yesenia Simmons MEDICAL RECORD NO: [id number] DATE OF BIRTH: 1971-03-25 FACILITY: [facility] LOCATION: [facility] PHYSICIAN: GILDARDO ALDERTON, MD   OPERATIVE REPORT   DATE OF PROCEDURE: 04/16/24    PREOPERATIVE DIAGNOSIS: Right carpal tunnel syndrome    POSTOPERATIVE DIAGNOSIS:  Right carpal tunnel syndrome   PROCEDURE: Right endoscopic carpal tunnel release   SURGEON:  GILDARDO ALDERTON, M.D.   ASSISTANT: Joesph Hooks, OPA   ANESTHESIA:  General   INTRAVENOUS FLUIDS:  Per anesthesia flow sheet.   ESTIMATED BLOOD LOSS:  Minimal.   COMPLICATIONS:  None.   SPECIMENS:  none   TOURNIQUET TIME:  7 minutes    DISPOSITION:  Stable to PACU.   INDICATIONS:  53 year old female with history of right carpal tunnel syndrome with clinical and electrodiagnostic evidence.  Patient was indicated for right open versus endoscopic carpal tunnel release.  After understanding risks and benefits, patient elects to have endoscopic carpal tunnel release.  Risks and benefits of surgery were discussed including the risks of infection, bleeding, scarring, stiffness, nerve injury, vascular injury, tendon injury, need for subsequent operation,persistent numbness , recurrence.  She voiced understanding of these risks and elected to proceed.  OPERATIVE COURSE: Patient was seen and identified in the preoperative area and marked appropriately.  Surgical consent had been signed. Preoperative IV antibiotic prophylaxis was given. She was transferred to the operating room and placed in supine position with the Right upper extremity on an arm board.  General anesthesia was induced by the anesthesiologist.  Right upper extremity was prepped and draped in normal sterile orthopedic fashion.  A surgical pause was performed between the surgeons, anesthesia, and operating room staff and all were in agreement as to the patient, procedure, and site of procedure.  Tourniquet was placed and padded  appropriately to the right upper arm.  The arm was exsanguinated and the tourniquet was inflated to 250 mmHg.  A 1.5 cm skin incision was designed transversely proximal to the wrist flexion crease.  This incision was carried down through the subcutaneous tissues and through the forearm fascia.  A synovial elevator was introduced to identify the carpal tunnel space.  Sequential dilators were then used to open the carpal tunnel.  The cannula from the SafeView system was introduced in antegrade fashion into the carpal tunnel, and a standard wrist endoscope was used to visualize the undersurface of the transverse carpal ligament.  A probe and a rasp were used to delineate the distal edge of the transverse carpal ligament and to clear the underlying synovial tissues.  At this juncture, a forward cutting blade was introduced in an antegrade fashion was used to divide the transverse carpal ligament in its entirety.  Care was taken to ensure complete division of the structure by probing the resultant defect.  The endoscopic instruments were then removed.  At this point in the procedure, the median nerve was identified at the wrist flexion crease.  The distal end of the forearm fascia was released using tenotomy scissors with particular care taken to avoid injury to the palmar cutaneous branch of the median nerve.  The median nerve appeared completely decompressed in both the palm and distal forearm.  The wound was copiously irrigated, the tourniquet was deflated and hemostasis was achieved with bipolar electrocautery.  Tourniquet time was 7 minutes.  The wound was closed with 4-0 nylon suture in vertical mattress fashion.  Sterile dressings were applied.  Patient was subsequently awoken from anesthesia and transported to the postoperative  unit in stable condition.  Post-operative plan: The patient will recover in the post-anesthesia care unit and then be discharged home.  The patient will be non weight bearing on  the right upper extremity in a soft dressing.   I will see the patient back in the office in 2 weeks for postoperative followup.    Dakota Vanwart, MD Electronically signed, 04/16/24

## 2024-04-16 NOTE — Discharge Instructions (Addendum)
    Hand Surgery Postop Instructions    Dressings: Maintain postoperative dressing for 5 days.   After 5 days, it is okay to unwrap postoperative dressing and apply Band-Aid or rewrap.   Keep operative site clean and dry until orthopedic follow-up.  Wound Care: Keep your hand elevated above the level of your heart.  Do not allow it to dangle by your side. Moving your fingers is advised to stimulate circulation but will depend on the site of your surgery.  If you have a splint applied, your doctor will advise you regarding movement.  Activity: Do not drive or operate machinery until clearance given from physician. No heavy lifting with operative extremity.  Diet:  Drink liquids today or eat a light diet.  You may resume a regular diet tomorrow.    General expectations: Pain for two to three days. Take prescribed medication if given, transition to over-the-counter medication as quickly as possible. Fingers may become slightly swollen.  Call your doctor if any of the following occur: Severe pain not relieved by pain medication. Elevated temperature. Dressing soaked with blood. Inability to move fingers. White or bluish color to fingers.   Per [facility] clinic policy, our goal is ensure optimal postoperative pain control with a multimodal pain management strategy. For all [facility] patients, our goal is to wean post-operative narcotic medications by 6 weeks post-operatively. If this is not possible due to utilization of pain medication prior to surgery, your [facility] doctor will support your acute post-operative pain control for the first 6 weeks postoperatively, with a plan to transition you back to your primary pain team following that. Maralee will work to ensure a Therapist, occupational.  Anshul Afton Alderton, M.D. Hand Surgery [facility]   Post Anesthesia Home Care Instructions  Activity: Get plenty of rest for the remainder of the day. A responsible individual  must stay with you for 24 hours following the procedure.  For the next 24 hours, DO NOT: -Drive a car -Advertising copywriter -Drink alcoholic beverages -Take any medication unless instructed by your physician -Make any legal decisions or sign important papers.  Meals: Start with liquid foods such as gelatin or soup. Progress to regular foods as tolerated. Avoid greasy, spicy, heavy foods. If nausea and/or vomiting occur, drink only clear liquids until the nausea and/or vomiting subsides. Call your physician if vomiting continues.  Special Instructions/Symptoms: Your throat may feel dry or sore from the anesthesia or the breathing tube placed in your throat during surgery. If this causes discomfort, gargle with warm salt water. The discomfort should disappear within 24 hours.  If you had a scopolamine patch placed behind your ear for the management of post- operative nausea and/or vomiting:  1. The medication in the patch is effective for 72 hours, after which it should be removed.  Wrap patch in a tissue and discard in the trash. Wash hands thoroughly with soap and water. 2. You may remove the patch earlier than 72 hours if you experience unpleasant side effects which may include dry mouth, dizziness or visual disturbances. 3. Avoid touching the patch. Wash your hands with soap and water after contact with the patch.  No tylenol  until after 4:50pm today

## 2024-04-16 NOTE — Transfer of Care (Signed)
 Immediate Anesthesia Transfer of Care Note  Patient: JASMYN PICHA  Procedure(s) Performed: RELEASE, CARPAL TUNNEL, ENDOSCOPIC (Right)  Patient Location: PACU  Anesthesia Type:General  Level of Consciousness: awake, alert , oriented, drowsy, and patient cooperative  Airway & Oxygen Therapy: Patient Spontanous Breathing and Patient connected to face mask oxygen  Post-op Assessment: Report given to RN and Post -op Vital signs reviewed and stable  Post vital signs: Reviewed and stable  Last Vitals:  Vitals Value Taken Time  BP    Temp    Pulse 75 04/16/24 10:15  Resp    SpO2 99 % 04/16/24 10:15  Vitals shown include unfiled device data.  Last Pain:  Vitals:   04/16/24 0917  TempSrc: Tympanic  PainSc: 0-No pain      Patients Stated Pain Goal: 7 (04/16/24 0917)  Complications: No notable events documented.

## 2024-04-17 ENCOUNTER — Encounter (HOSPITAL_BASED_OUTPATIENT_CLINIC_OR_DEPARTMENT_OTHER): Payer: Self-pay | Admitting: Orthopedic Surgery

## 2024-04-20 ENCOUNTER — Telehealth: Payer: Self-pay | Admitting: Orthopedic Surgery

## 2024-04-20 ENCOUNTER — Other Ambulatory Visit: Payer: Self-pay | Admitting: Orthopedic Surgery

## 2024-04-20 MED ORDER — OXYCODONE HCL 5 MG PO TABS: 5.0000 mg | ORAL_TABLET | Freq: Four times a day (QID) | ORAL | 0 refills | Status: AC | PRN

## 2024-04-20 NOTE — Telephone Encounter (Signed)
 Pt called stating that she needs a different medication, the one she has doesn't work. She stated that she also needs a Dr's note. Pt call back number is [phone]

## 2024-04-21 NOTE — Therapy (Signed)
 OUTPATIENT OCCUPATIONAL THERAPY ORTHO EVALUATION  Patient Name: Yesenia Simmons MRN: [id number] DOB:12-10-1970, 53 y.o., female Today's Date: 04/22/2024  PCP: Celestia Rosaline SQUIBB, NP REFERRING PROVIDER: Arlinda Buster, MD  END OF SESSION:  OT End of Session - 04/22/24 1613     Visit Number 1    Number of Visits 17   including eval   Date for Recertification  06/21/24    Authorization Type [employer]    OT Start Time 1532    OT Stop Time 1614    OT Time Calculation (min) 42 min    Equipment Utilized During Treatment testing materials, bandage    Activity Tolerance Patient tolerated treatment well    Behavior During Therapy Restless          Past Medical History:  Diagnosis Date   Arthritis    Asthma    CTS (carpal tunnel syndrome)    DM (diabetes mellitus) (HCC)    Hypertension    RBBB    Sinus tachycardia    Past Surgical History:  Procedure Laterality Date   CARPAL TUNNEL RELEASE Right 04/16/2024   Procedure: RELEASE, CARPAL TUNNEL, ENDOSCOPIC;  Surgeon: Arlinda Buster, MD;  Location: [facility];  Service: Orthopedics;  Laterality: Right;   TUBAL LIGATION     Patient Active Problem List   Diagnosis Date Noted   Carpal tunnel syndrome, right upper limb 04/16/2024   Right wrist pain 12/29/2019   Bilateral carpal tunnel syndrome 02/28/2017    ONSET DATE: 03/18/2024, 04/16/24 surgery  REFERRING DIAG: G56.03 (ICD-10-CM) - Bilateral carpal tunnel syndrome Per Dr. Erwin: Note:  Needs OT 5-6 days after Right endoscopic CTR (04/17/24) NO SPLINT  REFER TO [place]  HAND PROTOCOL PGS [id number] THERAPY DIAG:  Other lack of coordination  Muscle weakness (generalized)  Other symptoms and signs involving the musculoskeletal system  Other disturbances of skin sensation  Pain in right hand  Rationale for Evaluation and Treatment: Rehabilitation  SUBJECTIVE:   SUBJECTIVE STATEMENT: I had the surgery this past Friday. The pain is bad it  feels like it's going into the left hand now. Pt accompanied by: self  PERTINENT HISTORY: CTS/CTR, diabetes, arthritis, asthma, sinus tachycardia  PRECAUTIONS: None  RED FLAGS: None   WEIGHT BEARING RESTRICTIONS: No  PAIN:  Are you having pain? Yes: NPRS scale: 9/10 Pain location: R hand Pain description: throbbing Aggravating factors: putting pressure on R hand Relieving factors: resting, propping it up and elevating  FALLS: Has patient fallen in last 6 months? 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 Erwin, reports eventually will get CTR on L hand   OBJECTIVE:  Note: Objective measures were completed at Evaluation unless otherwise noted.  HAND DOMINANCE: Right  ADLs: Overall ADLs: Pt can complete most, but reports pain or requires increased time. Granddaughter is helping with showers, slip on shoes required   FUNCTIONAL OUTCOME MEASURES:  Quick DASH: 86.4/100    UPPER EXTREMITY ROM:     Active ROM Right eval Left eval  Shoulder flexion    Shoulder abduction    Shoulder adduction  Shoulder extension    Shoulder internal rotation    Shoulder external rotation    Elbow flexion    Elbow extension    Wrist flexion    Wrist extension    Wrist ulnar deviation    Wrist radial deviation    Wrist pronation    Wrist supination    (Blank rows = not tested)  Active ROM Right eval Left eval  Thumb MCP (0-60)    Thumb IP (0-80)    Thumb Radial abd/add (0-55)     Thumb Palmar abd/add (0-45)     Thumb Opposition to Small Finger     Index MCP  (0-90)     Index PIP (0-100)     Index DIP (0-70)      Long MCP (0-90)      Long PIP (0-100)      Long DIP (0-70)      Ring MCP (0-90)      Ring PIP (0-100)      Ring DIP (0-70)      Little MCP (0-90)      Little PIP (0-100)      Little DIP (0-70)      (Blank rows = not tested)   UPPER EXTREMITY MMT:     MMT Right eval Left eval  Shoulder flexion    Shoulder abduction    Shoulder adduction    Shoulder extension    Shoulder internal rotation    Shoulder external rotation    Middle trapezius    Lower trapezius    Elbow flexion    Elbow extension    Wrist flexion    Wrist extension    Wrist ulnar deviation    Wrist radial deviation    Wrist pronation    Wrist supination    (Blank rows = not tested)  HAND FUNCTION: Grip strength: Right: 7 lbs; Left: 14.3 lbs  COORDINATION: 9 Hole Peg test: Right: 48.34 sec; Left: 31.63 sec  SENSATION: Reports numbness and tingling  EDEMA: some swelling on radial aspect of wrist  COGNITION: Overall cognitive status: Within functional limits for tasks assessed   OBSERVATIONS: pain in R hand s/p CTR, as well as limited ROM, coordination,    TREATMENT DATE: 04/22/24 (6 days post op)                                                                                                                            Educated pt in purpose of OT, goals, and POC. Educated in tendon glides, see Pt instructions for detailed handout.     PATIENT EDUCATION: Education details: SEE ABOVE Person educated: Patient Education method: Explanation, Demonstration, and Handouts Education comprehension: verbalized understanding  HOME EXERCISE PROGRAM: Tendon glides  GOALS: Goals reviewed with patient? Yes  SHORT TERM GOALS: Target date: 05/23/24  Pt will be independent with HEP for coordination, wrist ROM, and grip strength Baseline: New to OP OT Goal status: INITIAL  2.  Pt will be  independent with scar tissue massage once sutures removed  and scar is healed Baseline: Still has sutures Goal status: INITIAL  3.  Pt will be independent with joint protection strategies to protect healing hand and reduce strain/overuse on L hand Baseline: New to OP OT Goal status: INITIAL  4. Pt will explore AE for reducing strain on both hands and promoting independence with ADLs Baseline: New to OP OT Goal status: INITIAL  5.  OT to assess Box and Blocks and create goal as appropriate Baseline: to be completed next tx session Goal status: INITIAL  6.  Pt will be independent with edema mgmt with use of handout only Baseline: New to OP OT Goal status: INITIAL  LONG TERM GOALS: Target date: 06/21/24  Pt will reduce QuickDASH score by at least 50 points Baseline: 86.4/100 Goal status: INITIAL  2.  Pt will demonstrate improved FM coordination completing 9HPT in R hand by at least 10 seconds Baseline:  Right: 48.34 sec; Left: 31.63 sec Goal status: INITIAL  3.  Pt will increase R grip strength by at least 10 pounds Baseline:  Right: 7 lbs; Left: 14.3 lbs Goal status: INITIAL     ASSESSMENT:  CLINICAL IMPRESSION: Patient is a 53 y.o. female who was seen today for occupational therapy evaluation for s/p CTR on 03/18/24. Hx includes diabetes, sinus tachycardia. Patient currently presents below baseline level of functioning demonstrating functional deficits and impairments as noted below. Pt would benefit from skilled OT services in the outpatient setting to work on impairments as noted below to help pt return to PLOF as able.     PERFORMANCE DEFICITS: in functional skills including ADLs, IADLs, coordination, dexterity, sensation, and strength, cognitive skills including safety awareness, and psychosocial skills including environmental adaptation and routines and behaviors.   IMPAIRMENTS: are limiting patient from ADLs, IADLs, rest and sleep, work, and leisure.   COMORBIDITIES: has co-morbidities such as diabetes that affects  occupational performance. Patient will benefit from skilled OT to address above impairments and improve overall function.  MODIFICATION OR ASSISTANCE TO COMPLETE EVALUATION: Min-Moderate modification of tasks or assist with assess necessary to complete an evaluation.  OT OCCUPATIONAL PROFILE AND HISTORY: Detailed assessment: Review of records and additional review of physical, cognitive, psychosocial history related to current functional performance.  CLINICAL DECISION MAKING: Moderate - several treatment options, min-mod task modification necessary  REHAB POTENTIAL: Good  EVALUATION COMPLEXITY: Moderate      PLAN:  OT FREQUENCY: 2x/week  OT DURATION: 8 weeks  PLANNED INTERVENTIONS: 97168 OT Re-evaluation, 97535 self care/ADL training, 02889 therapeutic exercise, 97530 therapeutic activity, 97140 manual therapy, 97035 ultrasound, 97039 fluidotherapy, 97010 moist heat, 97010 cryotherapy, 97760 Orthotic Initial, 97763 Orthotic/Prosthetic subsequent, scar mobilization, passive range of motion, energy conservation, coping strategies training, patient/family education, and DME and/or AE instructions  RECOMMENDED OTHER SERVICES: none  CONSULTED AND AGREED WITH PLAN OF CARE: Patient  PLAN FOR NEXT SESSION: f/u tendon glides Modalities when sutures removed and scar closed (fluido or ultrasound) Obtain Box and Blocks AE recommendations Scar tissue massage when sutures removed and scar closed Possible splint fabrication? Resting hand splint? Edema mgmt      Rocky Dutch, OT 04/22/2024, 4:29 PM

## 2024-04-22 ENCOUNTER — Telehealth: Payer: Self-pay | Admitting: Orthopedic Surgery

## 2024-04-22 ENCOUNTER — Ambulatory Visit: Attending: Orthopedic Surgery

## 2024-04-22 DIAGNOSIS — G5603 Carpal tunnel syndrome, bilateral upper limbs: Secondary | ICD-10-CM | POA: Insufficient documentation

## 2024-04-22 DIAGNOSIS — R278 Other lack of coordination: Secondary | ICD-10-CM | POA: Diagnosis present

## 2024-04-22 DIAGNOSIS — M6281 Muscle weakness (generalized): Secondary | ICD-10-CM | POA: Insufficient documentation

## 2024-04-22 DIAGNOSIS — R208 Other disturbances of skin sensation: Secondary | ICD-10-CM | POA: Insufficient documentation

## 2024-04-22 DIAGNOSIS — M79641 Pain in right hand: Secondary | ICD-10-CM | POA: Insufficient documentation

## 2024-04-22 DIAGNOSIS — R29898 Other symptoms and signs involving the musculoskeletal system: Secondary | ICD-10-CM | POA: Diagnosis present

## 2024-04-22 NOTE — Telephone Encounter (Signed)
 [facility] called to notify pt is taking 15 mg oxycodone  and 5 mg Dr. Erwin . Pharmacy number is [phone]. Please call pharmacy and advise

## 2024-04-22 NOTE — Telephone Encounter (Signed)
 Pt just left with note and I did not see this note before she left. Can you call pt about this matter. I told pt Dr Erwin needed to approve this medication and maybe he needed to send pre auth for med.

## 2024-04-22 NOTE — Telephone Encounter (Signed)
 Patient is in agreement with pain clinic We cannot call in any more pain medicine for patient.

## 2024-04-22 NOTE — Telephone Encounter (Signed)
 See other message. All future requests for narcotics from our office will be refused. Patient is being seen in pain clinic and gets 15mg  oxycodone  regularly

## 2024-04-23 NOTE — Telephone Encounter (Signed)
 Per previous conversations Patient is in agreement with pain clinic We cannot call in any more pain medicine for patient.

## 2024-04-29 ENCOUNTER — Ambulatory Visit (INDEPENDENT_AMBULATORY_CARE_PROVIDER_SITE_OTHER): Admitting: Orthopedic Surgery

## 2024-04-29 DIAGNOSIS — Z9889 Other specified postprocedural states: Secondary | ICD-10-CM

## 2024-04-29 NOTE — Progress Notes (Signed)
   Yesenia Simmons - 53 y.o. female MRN [id number]  Date of birth: 1971/04/15  Office Visit Note: Visit Date: 04/29/2024 PCP: Celestia Rosaline SQUIBB, NP Referred by: Celestia Rosaline SQUIBB, NP  Subjective:  HPI: Yesenia Simmons is a 53 y.o. female who presents today for follow up 2 weeks status post right endoscopic carpal tunnel release.  Numbness and tingling is improving, pain is controlled.  Pertinent ROS were reviewed with the patient and found to be negative unless otherwise specified above in HPI.   Assessment & Plan: Visit Diagnoses:  1. S/P endoscopic carpal tunnel release     Plan: She is doing well postoperatively, I am glad to see that her numbness and tingling is improving.  She has started with occupational therapy already which is great, can transition to home exercise program as tolerated.  She is interested in pursuing the contralateral side, I will have her return approximately-1 month to track her progress after the right endoscopic carpal tunnel release before we move forward with surgical scheduling for the left side.  She expressed full understanding.  Follow-up: No follow-ups on file.   Meds & Orders: No orders of the defined types were placed in this encounter.  No orders of the defined types were placed in this encounter.    Procedures: No procedures performed       Objective:   Vital Signs: LMP 08/04/2016 Comment: patient states bleeding is spotting  Ortho Exam Right wrist: - Well-healing transverse incision proximal to the wrist crease, sutures removed today, skin edges well-approximated without erythema or drainage - Wrist range of motion is well-preserved - Digital range of motion is preserved, sensation intact to light touch in median nerve distribution - APB 5/5 without significant thenar atrophy  Imaging: No results found.   Yesenia Simmons Yesenia Simmons, M.D. [facility], Hand Surgery

## 2024-04-30 ENCOUNTER — Telehealth: Payer: Self-pay

## 2024-04-30 ENCOUNTER — Ambulatory Visit

## 2024-04-30 NOTE — Telephone Encounter (Signed)
 Pt no-showed for OT appointment today. Therefore, OT called pt's listed phone number [phone] ). OT was able to reach pt, reminding pt of next OT appointment date/time, provided education on cancellation/no-show policy, recommended to pt to call clinic office if pt is unable to make appointments, and provided contact information of clinic.   Rocky Dutch, OTR/L

## 2024-05-03 ENCOUNTER — Encounter: Payer: Self-pay | Admitting: Radiology

## 2024-05-11 ENCOUNTER — Ambulatory Visit: Attending: Orthopedic Surgery

## 2024-05-11 DIAGNOSIS — R29898 Other symptoms and signs involving the musculoskeletal system: Secondary | ICD-10-CM | POA: Diagnosis present

## 2024-05-11 DIAGNOSIS — R208 Other disturbances of skin sensation: Secondary | ICD-10-CM | POA: Insufficient documentation

## 2024-05-11 DIAGNOSIS — M79641 Pain in right hand: Secondary | ICD-10-CM | POA: Diagnosis present

## 2024-05-11 DIAGNOSIS — M6281 Muscle weakness (generalized): Secondary | ICD-10-CM | POA: Diagnosis present

## 2024-05-11 NOTE — Patient Instructions (Addendum)
 WEBSITE: [email or web address]   Swelling Reduction  Place affected area above the level of your heart. You can use pillows for support. Move the affected area. Think about tightening the muscles in this area and then relaxing them.  "Pet" the area from the outside of your body toward the center of your body. If it is your hand, from the tips of your fingers to the shoulder or chest.

## 2024-05-11 NOTE — Therapy (Signed)
 OUTPATIENT OCCUPATIONAL THERAPY ORTHO TREATMENT  Patient Name: MILAYNA ROTENBERG MRN: [id number] DOB:August 30, 1970, 53 y.o., female Today's Date: 05/11/2024  PCP: Celestia Rosaline SQUIBB, NP REFERRING PROVIDER: Arlinda Buster, MD  END OF SESSION:  OT End of Session - 05/11/24 1022     Visit Number 2    Number of Visits 17    Date for Recertification  06/21/24    Authorization Type [employer] M[employer]    OT Start Time 1016    OT Stop Time 1101    OT Time Calculation (min) 45 min    Activity Tolerance Patient tolerated treatment well   Limited d/t distractions being on cell phone   Behavior During Therapy Restless           Past Medical History:  Diagnosis Date   Arthritis    Asthma    CTS (carpal tunnel syndrome)    DM (diabetes mellitus) (HCC)    Hypertension    RBBB    Sinus tachycardia    Past Surgical History:  Procedure Laterality Date   CARPAL TUNNEL RELEASE Right 04/16/2024   Procedure: RELEASE, CARPAL TUNNEL, ENDOSCOPIC;  Surgeon: Arlinda Buster, MD;  Location: [facility];  Service: Orthopedics;  Laterality: Right;   TUBAL LIGATION     Patient Active Problem List   Diagnosis Date Noted   Carpal tunnel syndrome, right upper limb 04/16/2024   Right wrist pain 12/29/2019   Bilateral carpal tunnel syndrome 02/28/2017    ONSET DATE: 03/18/2024, 04/16/24 surgery  REFERRING DIAG: G56.03 (ICD-10-CM) - Bilateral carpal tunnel syndrome Per Dr. Erwin: Note:  Needs OT 5-6 days after Right endoscopic CTR (04/17/24) NO SPLINT  REFER TO [place]  HAND PROTOCOL PGS 382-383 THERAPY DIAG:  Pain in right hand  Other disturbances of skin sensation  Other symptoms and signs involving the musculoskeletal system  Muscle weakness (generalized)  Rationale for Evaluation and Treatment: Rehabilitation  SUBJECTIVE:   SUBJECTIVE STATEMENT: Pt reports R hand doing a little better, sutures have been removed. Healing well.  Pt accompanied by: self  PERTINENT  HISTORY: CTS/CTR, diabetes, arthritis, asthma, sinus tachycardia  PRECAUTIONS: None  RED FLAGS: None   WEIGHT BEARING RESTRICTIONS: No  PAIN:  Are you having pain? Yes: NPRS scale: 7/10 Pain location: R wrist Pain description: throbbing Aggravating factors: putting pressure on R hand Relieving factors: resting, propping it up and elevating  FALLS: Has patient fallen in last 6 months? 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  HAND DOMINANCE: Right  ADLs: Overall ADLs: Pt can complete most, but reports pain or requires increased time. Granddaughter is helping with showers, slip on shoes required   FUNCTIONAL OUTCOME MEASURES:  Quick DASH: 86.4/100    UPPER EXTREMITY ROM:     Active ROM Right eval Left eval  Shoulder flexion    Shoulder abduction    Shoulder adduction    Shoulder extension    Shoulder internal rotation    Shoulder external rotation    Elbow flexion  Elbow extension    Wrist flexion    Wrist extension    Wrist ulnar deviation    Wrist radial deviation    Wrist pronation    Wrist supination    (Blank rows = not tested)  Active ROM Right eval Left eval  Thumb MCP (0-60)    Thumb IP (0-80)    Thumb Radial abd/add (0-55)     Thumb Palmar abd/add (0-45)     Thumb Opposition to Small Finger     Index MCP (0-90)     Index PIP (0-100)     Index DIP (0-70)      Long MCP (0-90)      Long PIP (0-100)      Long DIP (0-70)       Ring MCP (0-90)      Ring PIP (0-100)      Ring DIP (0-70)      Little MCP (0-90)      Little PIP (0-100)      Little DIP (0-70)      (Blank rows = not tested)   UPPER EXTREMITY MMT:     MMT Right eval Left eval  Shoulder flexion    Shoulder abduction    Shoulder adduction    Shoulder extension    Shoulder internal rotation    Shoulder external rotation    Middle trapezius    Lower trapezius    Elbow flexion    Elbow extension    Wrist flexion    Wrist extension    Wrist ulnar deviation    Wrist radial deviation    Wrist pronation    Wrist supination    (Blank rows = not tested)  HAND FUNCTION: Grip strength: Right: 7 lbs; Left: 14.3 lbs  COORDINATION: 9 Hole Peg test: Right: 48.34 sec; Left: 31.63 sec ( Was on phone during this assessment 05/11/24) Box and Blocks: Right: 48 blocks, Left: 37 blocks  SENSATION: Reports numbness and tingling  EDEMA: some swelling on radial aspect of wrist  COGNITION: Overall cognitive status: Within functional limits for tasks assessed   OBSERVATIONS: pain in R hand s/p CTR, as well as limited ROM, coordination,    TREATMENT DATE: 05/11/24 (3 weeks post op)                                                                                                                             Pt reports some swelling in volar aspect of wrist, educated pt in edema mgmt. See Pt instructions for handout provided for increased carryover. Looked at pt's wrist, very minimal scarring.  Obtained Box and Blocks this date. Limited ability to tx this date d/t pt being on phone this date. Educated pt in importance of limited distractions during tx to promote max potential being reached, pt agreed to not be on phone next tx.   Began very light strengthening with yellow theraputty, will continue next week pre IHP protocol.  Educated pt in  FM coordination activities including stacking coins, flipping cards, and pushing cards off of deck with  thumb.  Educated pt in sleep positioning to reduce pressure off of RUE and promote good nights sleep. See Pt instructions for handout. Next issued stretches per [facility] handouts, see Pt instructions for handout issued. Pt instructed that she may switch between tendon glides and IHP stretches during the week. Pt agreed.   PATIENT EDUCATION: Education details: SEE ABOVE Person educated: Patient Education method: Explanation, Demonstration, and Handouts Education comprehension: verbalized understanding  HOME EXERCISE PROGRAM: Tendon glides 05/11/24: IHP stretches (pgs. 15 and 16 in IHP handouts)  GOALS: Goals reviewed with patient? Yes  SHORT TERM GOALS: Target date: 05/23/24  Pt will be independent with HEP for coordination, wrist ROM, and grip strength Baseline: New to OP OT Goal status: INITIAL  2.  Pt will be independent with scar tissue massage once sutures removed and scar is healed Baseline: Still has sutures Goal status: INITIAL  3.  Pt will be independent with joint protection strategies to protect healing hand and reduce strain/overuse on L hand Baseline: New to OP OT Goal status: INITIAL  4. Pt will explore AE for reducing strain on both hands and promoting independence with ADLs Baseline: New to OP OT Goal status: INITIAL  5.  OT to assess Box and Blocks and create goal as appropriate Baseline: to be completed next tx session Goal status: INITIAL  6.  Pt will be independent with edema mgmt with use of handout only Baseline: New to OP OT Goal status: INITIAL  LONG TERM GOALS: Target date: 06/21/24  Pt will reduce QuickDASH score by at least 50 points Baseline: 86.4/100 Goal status: INITIAL  2.  Pt will demonstrate improved FM coordination completing 9HPT in R hand by at least 10 seconds Baseline:  Right: 48.34 sec; Left: 31.63 sec Goal status: INITIAL  3.  Pt will increase R grip strength by at least 10 pounds Baseline:  Right: 7 lbs; Left: 14.3 lbs Goal  status: INITIAL     ASSESSMENT:  CLINICAL IMPRESSION: Patient is a 53 y.o. female who was seen today for occupational therapy tx for s/p CTR on 03/18/24. Hx includes diabetes, sinus tachycardia. Pt was distracted during tx this date d/t being on cell phone but agreed to not be on phone next tx. Pt receptive to education and willing to try sleep positioning, edema mgmt strategies, and stretches provided this date. Patient currently presents below baseline level of functioning demonstrating functional deficits and impairments as noted below. Pt would benefit from skilled OT services in the outpatient setting to work on impairments as noted below to help pt return to PLOF as able.     PERFORMANCE DEFICITS: in functional skills including ADLs, IADLs, coordination, dexterity, sensation, and strength, cognitive skills including safety awareness, and psychosocial skills including environmental adaptation and routines and behaviors.   IMPAIRMENTS: are limiting patient from ADLs, IADLs, rest and sleep, work, and leisure.   COMORBIDITIES: has co-morbidities such as diabetes that affects occupational performance. Patient will benefit from skilled OT to address above impairments and improve overall function.  MODIFICATION OR ASSISTANCE TO COMPLETE EVALUATION: Min-Moderate modification of tasks or assist with assess necessary to complete an evaluation.  OT OCCUPATIONAL PROFILE AND HISTORY: Detailed assessment: Review of records and additional review of physical, cognitive, psychosocial history related to current functional performance.  CLINICAL DECISION MAKING: Moderate - several treatment options, min-mod task modification necessary  REHAB POTENTIAL: Good  EVALUATION COMPLEXITY: Moderate  PLAN:  OT FREQUENCY: 2x/week  OT DURATION: 8 weeks  PLANNED INTERVENTIONS: 97168 OT Re-evaluation, 97535 self care/ADL training, 02889 therapeutic exercise, 97530 therapeutic activity, 97140 manual  therapy, 97035 ultrasound, 97039 fluidotherapy, 97010 moist heat, 97010 cryotherapy, 97760 Orthotic Initial, 97763 Orthotic/Prosthetic subsequent, scar mobilization, passive range of motion, energy conservation, coping strategies training, patient/family education, and DME and/or AE instructions  RECOMMENDED OTHER SERVICES: none  CONSULTED AND AGREED WITH PLAN OF CARE: Patient  PLAN FOR NEXT SESSION:  Modalities (fluido or ultrasound) Re-obtain Box and Blocks with no distractions AE recommendations Scar tissue massage? Possible splint fabrication? Resting hand splint? F/u sleep positioning    Rocky Dutch, OT 05/11/2024, 11:11 AM

## 2024-05-13 ENCOUNTER — Ambulatory Visit

## 2024-05-14 ENCOUNTER — Ambulatory Visit

## 2024-05-14 ENCOUNTER — Telehealth: Payer: Self-pay

## 2024-05-14 NOTE — Telephone Encounter (Signed)
 Pt no-showed for OT appointment today. Therefore, OT called pt's listed phone number [phone]). OT left voicemail reminding pt of next OT appointment date/time, provided education on cancellation/no-show policy, recommended to pt to call clinic office if pt is unable to make appointments, and provided contact information of clinic.  OT to cancel all except 1 OT appointment if pt no-shows for next OT appointment.   Rocky Dutch, OTR/L

## 2024-05-20 ENCOUNTER — Ambulatory Visit

## 2024-05-20 ENCOUNTER — Telehealth: Payer: Self-pay

## 2024-05-20 NOTE — Telephone Encounter (Signed)
 Pt no-showed for OT appointment today. Therefore, OT called pt's listed phone number [phone]). OT was able to get in touch with pt, reminding pt of next OT appointment date/time. Pt recently started new job, requesting appts be in afternoons now. All appts have been moved to afternoons, one appt cancelled next week.   Younique Casad, OTR/L   .

## 2024-05-25 ENCOUNTER — Ambulatory Visit: Admitting: Occupational Therapy

## 2024-05-30 NOTE — Progress Notes (Deleted)
   Yesenia Simmons - 53 y.o. female MRN [id number]  Date of birth: [birth year]  Office Visit Note: Visit Date: 05/31/2024 PCP: Celestia Rosaline SQUIBB, NP Referred by: Celestia Rosaline SQUIBB, NP  Subjective:  HPI: Yesenia Simmons is a 53 y.o. female who presents today for follow up 9 weeks status post right wrist endoscopic carpal tunnel release.  Pertinent ROS were reviewed with the patient and found to be negative unless otherwise specified above in HPI.   Assessment & Plan: Visit Diagnoses: No diagnosis found.  Plan: ***  Follow-up: No follow-ups on file.   Meds & Orders: No orders of the defined types were placed in this encounter.  No orders of the defined types were placed in this encounter.    Procedures: No procedures performed       Objective:   Vital Signs: LMP 08/04/2016 Comment: patient states bleeding is spotting  Ortho Exam ***  Imaging: No results found.   Anshul Afton Alderton, M.D. [facility], Hand Surgery

## 2024-05-31 ENCOUNTER — Ambulatory Visit: Admitting: Orthopedic Surgery

## 2024-06-01 ENCOUNTER — Ambulatory Visit: Payer: Self-pay

## 2024-06-01 ENCOUNTER — Ambulatory Visit: Attending: Orthopedic Surgery

## 2024-06-01 DIAGNOSIS — R29898 Other symptoms and signs involving the musculoskeletal system: Secondary | ICD-10-CM

## 2024-06-01 DIAGNOSIS — R208 Other disturbances of skin sensation: Secondary | ICD-10-CM

## 2024-06-01 DIAGNOSIS — M79641 Pain in right hand: Secondary | ICD-10-CM

## 2024-06-01 NOTE — Therapy (Deleted)
 [facility] [address] [place], [place], [address] Phone: [phone]   Fax:  [phone]  Patient Details  Name: Yesenia Simmons MRN: [id number] Date of Birth: 26-Nov-1970 Referring Provider:  No ref. provider found  Encounter Date: 06/01/2024 OCCUPATIONAL THERAPY DISCHARGE SUMMARY  Visits from Start of Care: Including evaluation pt received 2 visits from 04/22/24-06/01/24.  Current functional level related to goals / functional outcomes: Pt met 0/6 STG and 0/3 LTGs, only attended 1 tx session.    Remaining deficits: Pain, coordination   Education / Equipment: Pt educated in purpose of OT and tendon glides to assist with numbness and tingling as well as functional movement of affected R hand s/p CTR.   Called pt and left voicemail informing of discharge d/t not attending appts. Patient goals were not met. Patient is being discharged due to not attending appts.. Discharge completed at no charge.     Rocky Dutch, OT 06/01/2024, 4:06 PM  [facility] [address] [place], [place], [address] Phone: [phone]   Fax:  [phone]

## 2024-06-01 NOTE — Therapy (Signed)
 [facility] [address] [place], [place], [address] Phone: [phone]   Fax:  [phone]  Patient Details  Name: Yesenia Simmons MRN: [id number] Date of Birth: 08/24/1970 Referring Provider:  No ref. provider found  Encounter Date: 06/01/2024 OCCUPATIONAL THERAPY DISCHARGE SUMMARY  Visits from Start of Care: Including eval pt received 2 visits from 04/22/24-06/01/24.  Current functional level related to goals / functional outcomes: Pt met 0/6 STGs and 0/3 LTGs d/t only attending 1 tx session.   Remaining deficits: Pain s/p CTR R Hand   Education / Equipment: Educated in purpose of OT and educated in tendon glides for addressing improved circulation and reduced swelling and functional movement of affected R hand   Pt was called this date, left voicemail informing of discharge. Patient goals were not met. Patient is being discharged due to not attending recent appts.. Discharge completed at no charge.     Rocky Dutch, OT 06/01/2024, 4:18 PM  [facility] [address] [place], [place], [address] Phone: [phone]   Fax:  [phone]

## 2024-06-03 ENCOUNTER — Ambulatory Visit

## 2024-06-08 ENCOUNTER — Ambulatory Visit

## 2024-06-08 ENCOUNTER — Ambulatory Visit: Admitting: Orthopedic Surgery

## 2024-06-10 ENCOUNTER — Ambulatory Visit

## 2024-06-15 ENCOUNTER — Ambulatory Visit

## 2024-06-17 ENCOUNTER — Ambulatory Visit

## 2024-06-22 ENCOUNTER — Ambulatory Visit

## 2024-06-29 ENCOUNTER — Ambulatory Visit
